# Patient Record
Sex: Female | Born: 1955 | Race: Asian | Hispanic: No | Marital: Married | Smoking: Never smoker
Health system: Southern US, Community
[De-identification: ages and names within clinical notes are randomized; demographics above are authoritative.]

## PROBLEM LIST (undated history)

## (undated) DIAGNOSIS — E162 Hypoglycemia, unspecified: Secondary | ICD-10-CM

---

## 1998-05-23 ENCOUNTER — Emergency Department (HOSPITAL_COMMUNITY): Admission: EM | Admit: 1998-05-23 | Discharge: 1998-05-23 | Payer: Self-pay

## 1998-05-31 ENCOUNTER — Encounter: Admission: RE | Admit: 1998-05-31 | Discharge: 1998-05-31 | Payer: Self-pay | Admitting: *Deleted

## 1999-12-02 ENCOUNTER — Encounter: Admission: RE | Admit: 1999-12-02 | Discharge: 1999-12-02 | Payer: Self-pay | Admitting: Family Medicine

## 1999-12-02 ENCOUNTER — Encounter: Payer: Self-pay | Admitting: Family Medicine

## 1999-12-11 ENCOUNTER — Encounter: Admission: RE | Admit: 1999-12-11 | Discharge: 2000-01-14 | Payer: Self-pay | Admitting: Family Medicine

## 2001-03-28 ENCOUNTER — Encounter: Admission: RE | Admit: 2001-03-28 | Discharge: 2001-03-28 | Payer: Self-pay | Admitting: Family Medicine

## 2001-04-26 ENCOUNTER — Encounter: Admission: RE | Admit: 2001-04-26 | Discharge: 2001-04-26 | Payer: Self-pay | Admitting: Sports Medicine

## 2001-05-02 ENCOUNTER — Ambulatory Visit (HOSPITAL_COMMUNITY): Admission: RE | Admit: 2001-05-02 | Discharge: 2001-05-02 | Payer: Self-pay

## 2001-05-23 ENCOUNTER — Encounter: Admission: RE | Admit: 2001-05-23 | Discharge: 2001-05-23 | Payer: Self-pay | Admitting: Family Medicine

## 2001-05-27 ENCOUNTER — Encounter: Admission: RE | Admit: 2001-05-27 | Discharge: 2001-05-27 | Payer: Self-pay | Admitting: Family Medicine

## 2001-06-03 ENCOUNTER — Encounter (INDEPENDENT_AMBULATORY_CARE_PROVIDER_SITE_OTHER): Payer: Self-pay | Admitting: *Deleted

## 2001-06-03 LAB — CONVERTED CEMR LAB

## 2001-07-04 ENCOUNTER — Encounter: Admission: RE | Admit: 2001-07-04 | Discharge: 2001-07-04 | Payer: Self-pay | Admitting: Family Medicine

## 2001-07-08 ENCOUNTER — Inpatient Hospital Stay (HOSPITAL_COMMUNITY): Admission: AD | Admit: 2001-07-08 | Discharge: 2001-07-08 | Payer: Self-pay | Admitting: Obstetrics & Gynecology

## 2001-07-22 ENCOUNTER — Encounter: Admission: RE | Admit: 2001-07-22 | Discharge: 2001-07-22 | Payer: Self-pay | Admitting: Family Medicine

## 2001-08-04 ENCOUNTER — Encounter: Admission: RE | Admit: 2001-08-04 | Discharge: 2001-08-04 | Payer: Self-pay | Admitting: Family Medicine

## 2001-08-10 ENCOUNTER — Encounter: Admission: RE | Admit: 2001-08-10 | Discharge: 2001-08-10 | Payer: Self-pay | Admitting: Family Medicine

## 2001-08-15 ENCOUNTER — Encounter: Admission: RE | Admit: 2001-08-15 | Discharge: 2001-08-15 | Payer: Self-pay | Admitting: Family Medicine

## 2001-08-19 ENCOUNTER — Encounter (INDEPENDENT_AMBULATORY_CARE_PROVIDER_SITE_OTHER): Payer: Self-pay

## 2001-08-19 ENCOUNTER — Inpatient Hospital Stay (HOSPITAL_COMMUNITY): Admission: AD | Admit: 2001-08-19 | Discharge: 2001-08-23 | Payer: Self-pay | Admitting: Obstetrics & Gynecology

## 2001-09-24 ENCOUNTER — Inpatient Hospital Stay (HOSPITAL_COMMUNITY): Admission: AD | Admit: 2001-09-24 | Discharge: 2001-09-24 | Payer: Self-pay | Admitting: *Deleted

## 2001-12-08 ENCOUNTER — Encounter: Admission: RE | Admit: 2001-12-08 | Discharge: 2001-12-08 | Payer: Self-pay | Admitting: Family Medicine

## 2003-11-16 ENCOUNTER — Encounter: Admission: RE | Admit: 2003-11-16 | Discharge: 2003-11-16 | Payer: Self-pay | Admitting: Family Medicine

## 2006-10-01 ENCOUNTER — Encounter (INDEPENDENT_AMBULATORY_CARE_PROVIDER_SITE_OTHER): Payer: Self-pay | Admitting: *Deleted

## 2009-04-27 ENCOUNTER — Emergency Department (HOSPITAL_COMMUNITY): Admission: EM | Admit: 2009-04-27 | Discharge: 2009-04-27 | Payer: Self-pay | Admitting: Emergency Medicine

## 2010-11-07 LAB — URINALYSIS, ROUTINE W REFLEX MICROSCOPIC
Bilirubin Urine: NEGATIVE
Glucose, UA: NEGATIVE mg/dL
Ketones, ur: NEGATIVE mg/dL
Nitrite: NEGATIVE
Specific Gravity, Urine: 1.014 (ref 1.005–1.030)
pH: 6 (ref 5.0–8.0)

## 2010-11-07 LAB — POCT I-STAT, CHEM 8
BUN: 5 mg/dL — ABNORMAL LOW (ref 6–23)
Calcium, Ion: 1.17 mmol/L (ref 1.12–1.32)
Chloride: 104 mEq/L (ref 96–112)
Creatinine, Ser: 0.5 mg/dL (ref 0.4–1.2)
Glucose, Bld: 77 mg/dL (ref 70–99)
Potassium: 3.7 mEq/L (ref 3.5–5.1)

## 2010-11-07 LAB — GLUCOSE, CAPILLARY: Glucose-Capillary: 73 mg/dL (ref 70–99)

## 2010-11-07 LAB — URINE MICROSCOPIC-ADD ON

## 2010-12-19 NOTE — Discharge Summary (Signed)
[facility]  Patient:    Michele Stephenson, Michele Stephenson Visit Number: [id number] MRN: [id number]          Service Type: OBS Location: [id number] Attending Physician:  Antionette Char Dictated by:   Ed Blalock. Burnadette Peter, M.D. Admit Date:  08/19/2001 Discharge Date: 08/23/2001                             Discharge Summary  DATE OF BIRTH:                1956-06-25  HISTORY OF PRESENT ILLNESS:   A 55 year old G3, P2-0-0-2 presented at 40-2/7 weeks with spontaneous rupture of membranes and contractions.  She is a patient of Dr. Turner Daniels at the family practice center with pregnancy complicated by advanced maternal age.  She has an obstetrical history of spontaneous vaginal delivery in 1992 and 1995.  No GYN, medical, or surgical history.  FAMILY/SOCIAL HISTORY:        She is [place] with limited [place].  PRENATAL LABORATORY DATA:     Normal.  PHYSICAL EXAMINATION:  PELVIC:                       Admission exam showed speculum with scant, clear fluid.  Positive fern.  Digital cervical exam showed her to be 2, 60, -3, with vertex.  The fetal heart was 130 with positive accelerations and average variability with contractions every 3-5 minutes.  HOSPITAL COURSE:              The patient underwent low transverse C-section secondary to arrest of active phase of labor and failure to descend after laboring for some time and undergoing amnioinfusion and Pitocin augmentation of labor.  She was delivered of a viable female infant, Apgars 7 and 9, cord pH 7.29.  She had a routine postop course complicated only by some hemorrhoidal pain which was relieved with Anusol and on hospital day #3 was requesting discharge.  DISCHARGE ACTIVITY:           Pelvic rest, otherwise ad lib.  DISCHARGE DIET:               Regular.  DISCHARGE MEDICATIONS:        1. Percocet 5/325 mg 1 p.o. q.4h. p.r.n. pain.                               2. Motrin 800 mg 1 p.o. t.i.d. x5 days  then                                  p.r.n. t.i.d.                               3. Iron sulfate 325 mg 1 p.o. b.i.d.  DISCHARGE FOLLOWUP:           She will be seen in [facility] in six weeks for routine postpartum care.  DISCHARGE CONDITION:          Improved.  DISCHARGE DIAGNOSES:          1. Intrauterine pregnancy at term.  2. Advanced maternal age.                               3. Arrest of active phase of labor and arrest of                                  descent.                               4. Status post low transverse cesarean section.                               5. External hemorrhoids.                               6. Anemia. Dictated by:   Ed Blalock. Burnadette Peter, M.D. Attending Physician:  Antionette Char DD:  08/23/01 TD:  08/24/01 Job: [id number]

## 2010-12-19 NOTE — Op Note (Signed)
[facility]  Patient:    [name], Michele Stephenson Visit Number: [id number] MRN: [id number]          Service Type: ANT Location: [facility] Attending Physician:  Antionette Char Dictated by:   Conni Elliot, M.D. Proc. Date: 08/20/01                             Operative Report  PREOPERATIVE DIAGNOSIS:       Failure to descend and repetitive variable fetal heart decelerations.  POSTOPERATIVE DIAGNOSIS:      Failure to descend and repetitive variable fetal heart decelerations, body/arm/leg cord.  OPERATION:                    Low transverse cesarean.  SURGEON:                      Conni Elliot, M.D.  ANESTHESIA:                   Continuous lumbar epidural.  OPERATIVE FINDINGS:           An 8 pound 5 ounce female with Apgars of 7 and 9.  Cord pH 7.29.  Placenta was sent to pathology.  Amniotic fluid was clear, although there was evidence of meconium in the vernix on the baby.  OPERATIVE PROCEDURE:          Patient brought to the operating room with spinal in left lateral tilt position receiving oxygen.  Abdomen was prepped and draped in a sterile fashion.  Low transverse Pfannenstiel incision was made.  Incision made through the skin and subcutaneous fascia.  Rectus muscles separated in the midline.  Peritoneal cavity entered.  Bladder flap created. Low transverse uterine incision was made.  Baby was delivered from vertex presentation.  Cord doubly clamped and cut.  Baby was DeLee suctioned prior to delivery of the chest.  Remainder of body delivered.  Placenta was delivered spontaneously.  Uterus, bladder flap, and superior fascia, subcutaneous skin ______ fashion.  Estimated blood loss less than 800 cc.  Needle and sponge correct. Dictated by:   Conni Elliot, M.D. Attending Physician:  Antionette Char DD:  08/20/01 TD:  08/22/01 Job: [id number]

## 2011-01-16 IMAGING — CT CT HEAD W/O CM
1 of 2 series · 13 of 30 positions shown, 17 images · non-contrast
Comparison: None

CLINICAL DATA: Dizziness.  Headache.

CT HEAD WITHOUT CONTRAST
TECHNIQUE: Contiguous axial images were obtained from the base of
the skull through the vertex without contrast.

[Series 2: brain · axial · 0.47mm/px · z∈[+112,+233]mm · 13 of 28 slices shown, 17 images]
[im 2/28  brain]
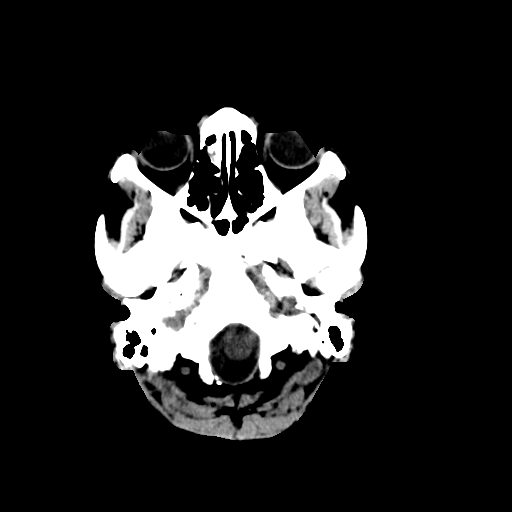
[im 2/28  bone]
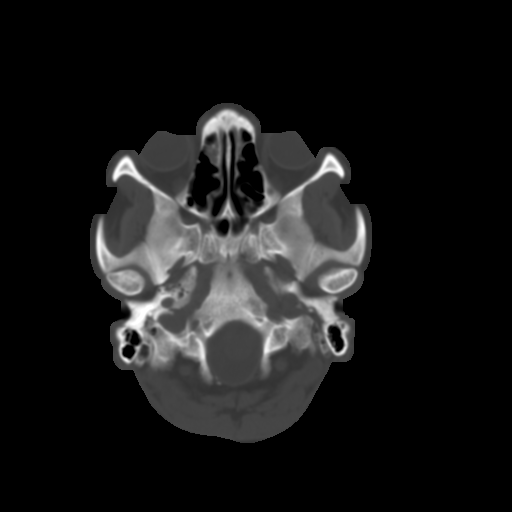
[im 4/28  brain]
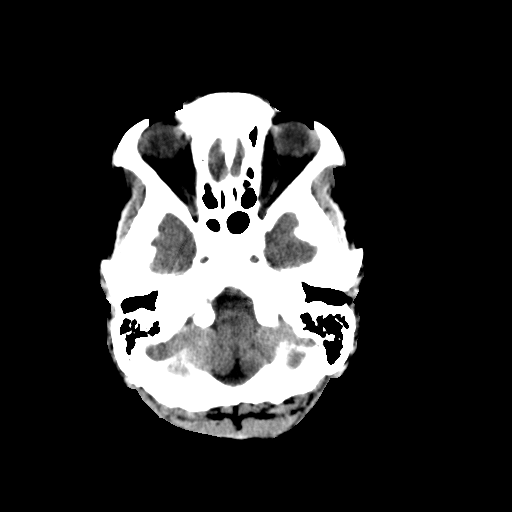
[im 6/28  brain]
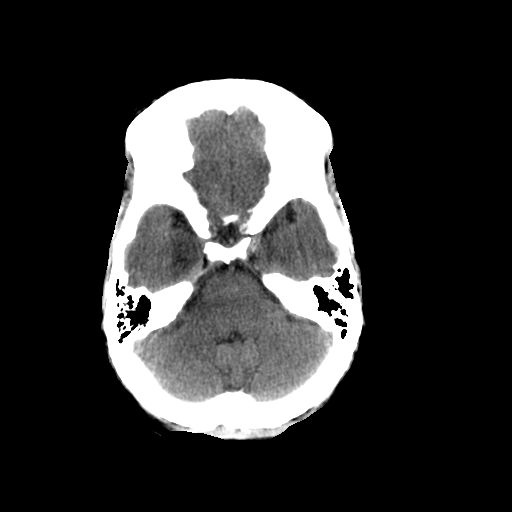
[im 8/28  brain]
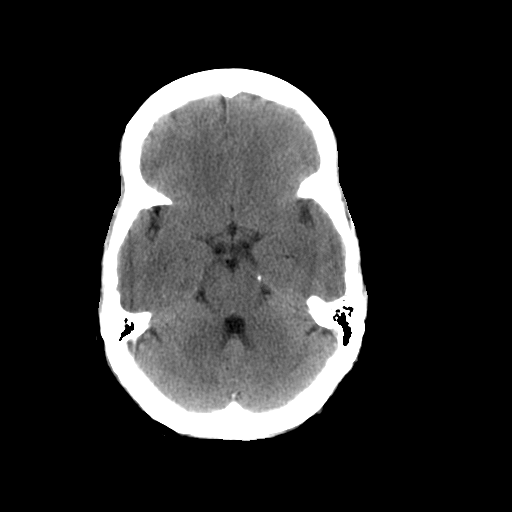
[im 10/28  brain]
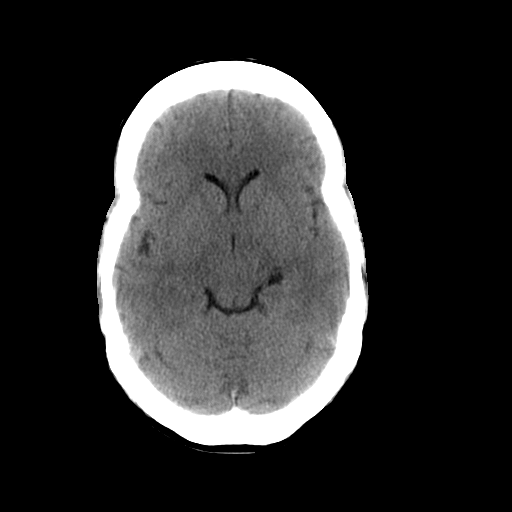
[im 10/28  bone]
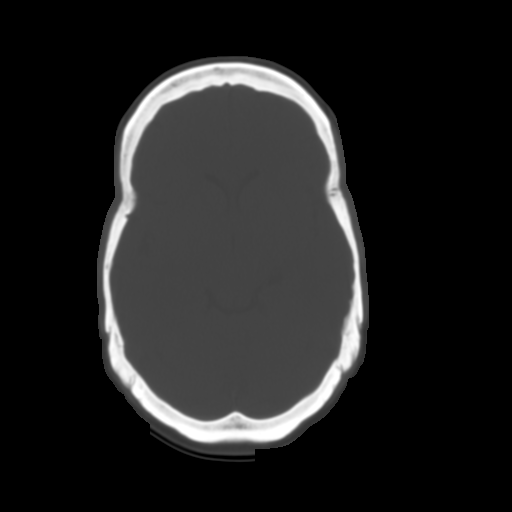
[im 12/28  brain]
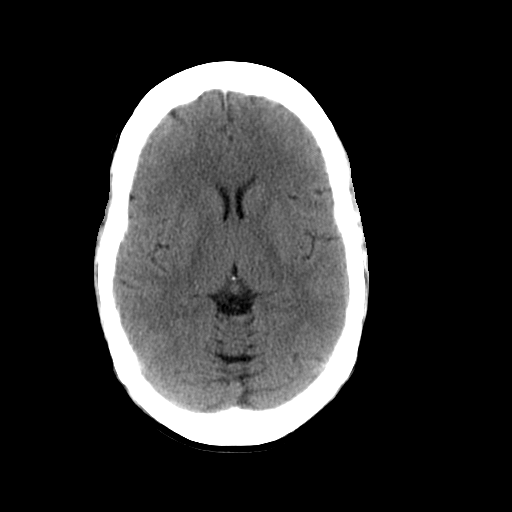
[im 14/28  brain]
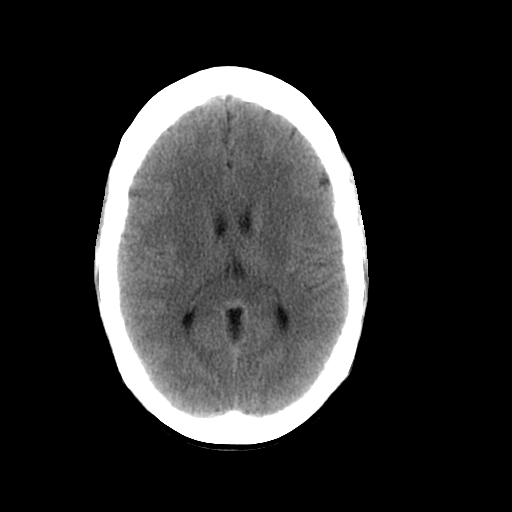
[im 16/28  brain]
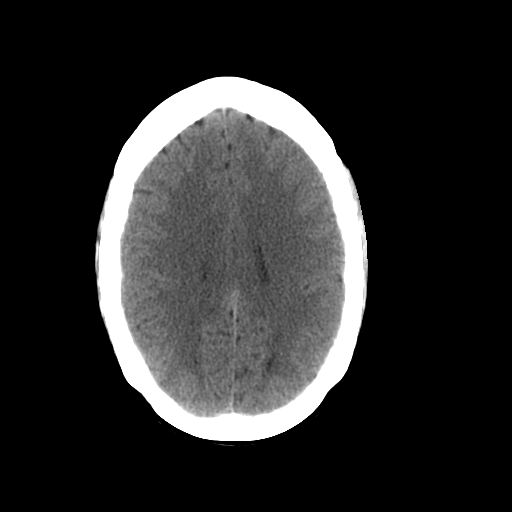
[im 18/28  brain]
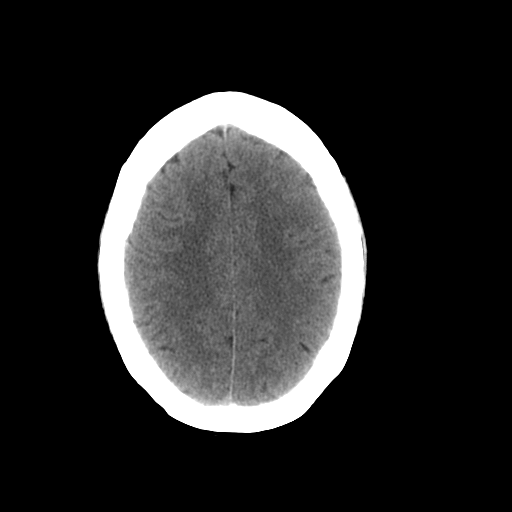
[im 18/28  bone]
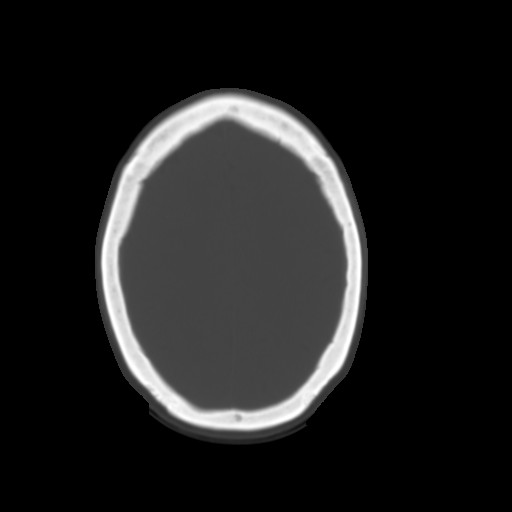
[im 20/28  brain]
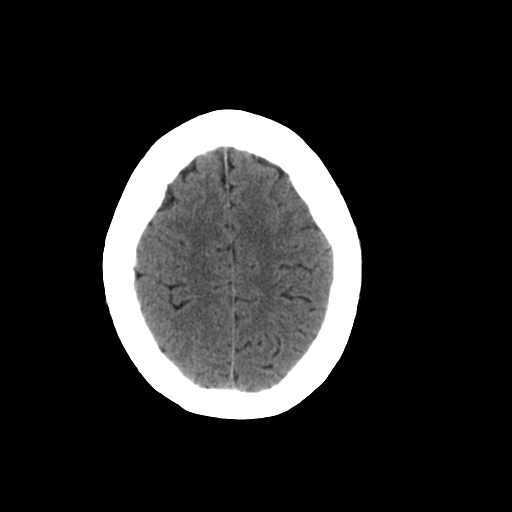
[im 22/28  brain]
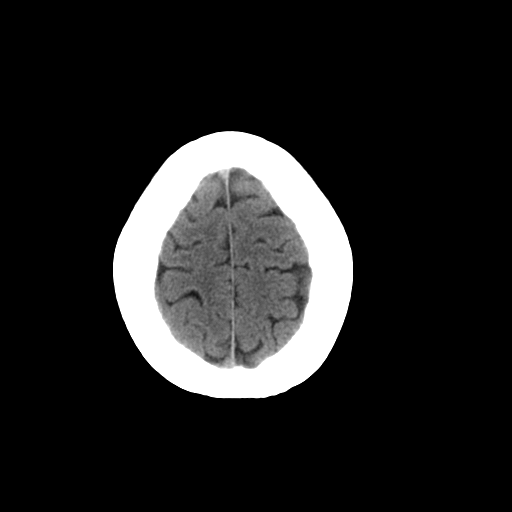
[im 24/28  brain]
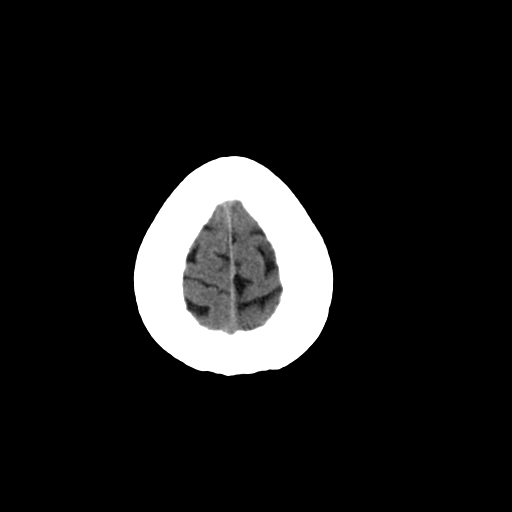
[im 26/28  brain]
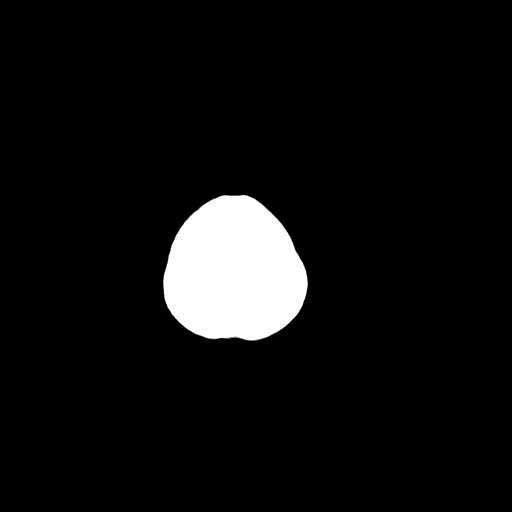
[im 26/28  bone]
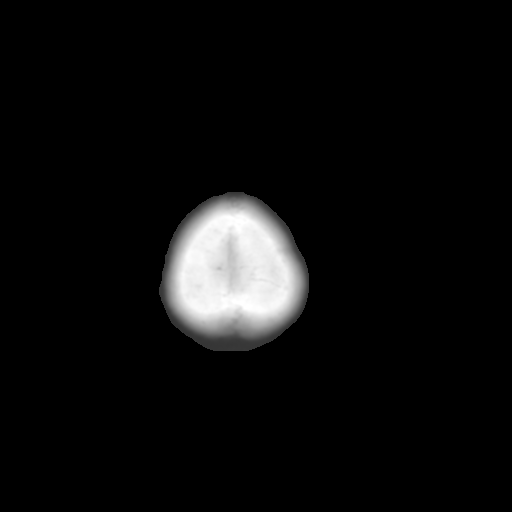

[13 of 30 positions shown; findings below may reference images not displayed]

FINDINGS: No acute intracranial findings.  Negative for hemorrhage,
mass lesion, or acute infarction.  No hydrocephalus.  There is a 7
x 8 mm left choroidal fissure cyst.  This is a benign incidental
finding.
IMPRESSION: No acute intracranial findings.  Incidental left choroidal fissure
cyst.

## 2012-01-13 ENCOUNTER — Other Ambulatory Visit: Payer: Self-pay | Admitting: Legal Medicine

## 2012-01-13 DIAGNOSIS — Z1231 Encounter for screening mammogram for malignant neoplasm of breast: Secondary | ICD-10-CM

## 2012-01-28 ENCOUNTER — Ambulatory Visit
Admission: RE | Admit: 2012-01-28 | Discharge: 2012-01-28 | Disposition: A | Discharge status: Home / Self Care | Payer: PRIVATE HEALTH INSURANCE | Source: Ambulatory Visit | Attending: Legal Medicine | Admitting: Legal Medicine

## 2012-01-28 ENCOUNTER — Ambulatory Visit: Payer: Self-pay

## 2012-01-28 DIAGNOSIS — Z1231 Encounter for screening mammogram for malignant neoplasm of breast: Secondary | ICD-10-CM

## 2012-03-30 ENCOUNTER — Emergency Department (HOSPITAL_COMMUNITY)
Admission: EM | Admit: 2012-03-30 | Discharge: 2012-03-31 | Disposition: A | Discharge status: Home / Self Care | Payer: PRIVATE HEALTH INSURANCE | Attending: Emergency Medicine | Admitting: Emergency Medicine

## 2012-03-30 DIAGNOSIS — R0602 Shortness of breath: Secondary | ICD-10-CM | POA: Insufficient documentation

## 2012-03-30 DIAGNOSIS — A088 Other specified intestinal infections: Secondary | ICD-10-CM | POA: Insufficient documentation

## 2012-03-30 DIAGNOSIS — R51 Headache: Secondary | ICD-10-CM

## 2012-03-30 DIAGNOSIS — D649 Anemia, unspecified: Secondary | ICD-10-CM | POA: Insufficient documentation

## 2012-03-30 DIAGNOSIS — R5381 Other malaise: Secondary | ICD-10-CM | POA: Insufficient documentation

## 2012-03-30 DIAGNOSIS — R7309 Other abnormal glucose: Secondary | ICD-10-CM | POA: Insufficient documentation

## 2012-03-30 DIAGNOSIS — R739 Hyperglycemia, unspecified: Secondary | ICD-10-CM

## 2012-03-30 DIAGNOSIS — R42 Dizziness and giddiness: Secondary | ICD-10-CM

## 2012-03-30 DIAGNOSIS — A084 Viral intestinal infection, unspecified: Secondary | ICD-10-CM

## 2012-03-30 DIAGNOSIS — R5383 Other fatigue: Secondary | ICD-10-CM | POA: Insufficient documentation

## 2012-03-30 NOTE — ED Notes (Addendum)
Pt is having dizziness, nausua, vomited once on Monday; had lunch today; having stomach cramping; last BM Saturday; also reporting headache.

## 2012-03-31 ENCOUNTER — Emergency Department (HOSPITAL_COMMUNITY): Payer: PRIVATE HEALTH INSURANCE

## 2012-03-31 LAB — CBC WITH DIFFERENTIAL/PLATELET
Basophils Absolute: 0 10*3/uL (ref 0.0–0.1)
Eosinophils Relative: 1 % (ref 0–5)
HCT: 32.2 % — ABNORMAL LOW (ref 36.0–46.0)
Hemoglobin: 10.4 g/dL — ABNORMAL LOW (ref 12.0–15.0)
Lymphocytes Relative: 20 % (ref 12–46)
MCV: 64.9 fL — ABNORMAL LOW (ref 78.0–100.0)
Monocytes Relative: 5 % (ref 3–12)
Neutro Abs: 6.5 10*3/uL (ref 1.7–7.7)
RBC: 4.96 MIL/uL (ref 3.87–5.11)
RDW: 18.4 % — ABNORMAL HIGH (ref 11.5–15.5)
WBC: 8.8 10*3/uL (ref 4.0–10.5)

## 2012-03-31 LAB — COMPREHENSIVE METABOLIC PANEL
AST: 20 U/L (ref 0–37)
Albumin: 3.7 g/dL (ref 3.5–5.2)
Alkaline Phosphatase: 62 U/L (ref 39–117)
BUN: 9 mg/dL (ref 6–23)
CO2: 24 mEq/L (ref 19–32)
Chloride: 101 mEq/L (ref 96–112)
GFR calc non Af Amer: >90 mL/min (ref >90)
Potassium: 3.3 mEq/L — ABNORMAL LOW (ref 3.5–5.1)
Total Bilirubin: 0.4 mg/dL (ref 0.3–1.2)

## 2012-03-31 LAB — URINE MICROSCOPIC-ADD ON

## 2012-03-31 LAB — URINALYSIS, ROUTINE W REFLEX MICROSCOPIC
Glucose, UA: NEGATIVE mg/dL
Hgb urine dipstick: NEGATIVE
Ketones, ur: NEGATIVE mg/dL
Protein, ur: NEGATIVE mg/dL

## 2012-03-31 MED ORDER — ONDANSETRON HCL 4 MG/2ML IJ SOLN
4.0000 mg | Freq: Once | INTRAMUSCULAR | Status: AC
  Administered 2012-03-31: 4 mg via INTRAVENOUS
  Filled 2012-03-31: qty 2

## 2012-03-31 MED ORDER — ACETAMINOPHEN 325 MG PO TABS
650.0000 mg | ORAL_TABLET | Freq: Once | ORAL | Status: AC
  Administered 2012-03-31: 650 mg via ORAL
  Filled 2012-03-31: qty 2

## 2012-03-31 MED ORDER — LORAZEPAM 2 MG/ML IJ SOLN
1.0000 mg | Freq: Once | INTRAMUSCULAR | Status: AC
  Administered 2012-03-31: 1 mg via INTRAVENOUS
  Filled 2012-03-31: qty 1

## 2012-03-31 MED ORDER — ONDANSETRON HCL 4 MG PO TABS: 4.0000 mg | ORAL_TABLET | Freq: Four times a day (QID) | ORAL | Status: AC

## 2012-03-31 MED ORDER — MECLIZINE HCL 50 MG PO TABS: 50.0000 mg | ORAL_TABLET | Freq: Three times a day (TID) | ORAL | Status: AC | PRN

## 2012-03-31 MED ORDER — MECLIZINE HCL 25 MG PO TABS
25.0000 mg | ORAL_TABLET | Freq: Once | ORAL | Status: AC
  Administered 2012-03-31: 25 mg via ORAL
  Filled 2012-03-31: qty 1

## 2012-03-31 MED ORDER — SODIUM CHLORIDE 0.9 % IV BOLUS (SEPSIS)
1000.0000 mL | Freq: Once | INTRAVENOUS | Status: AC
  Administered 2012-03-31: 1000 mL via INTRAVENOUS

## 2012-03-31 NOTE — ED Provider Notes (Signed)
History     CSN: [id number]  Arrival date & time 03/30/12  2313   First MD Initiated Contact with Patient 03/30/12 2351      No chief complaint on file.   (Consider location/radiation/quality/duration/timing/severity/associated sxs/prior treatment) HPI 56 year old [place] female presents to emergency department with multiple complaints. Patient does not speak [place], and daughter is doing the translating. Patient has had 3-4 days of generalized weakness and dizziness described as vertigo, diffuse abdominal pain, headache and neck pain, and constipation. Patient denies fevers, she's been eating and drinking well. Prior surgeries include C-section. She has a history of vertigo and has had taken meclizine in the past with good results. Patient had one episode of vomiting on Monday, none since then. Patient has a history of headaches which she normally takes over-the-counter medications, but has not done so this this time, has been taking herbal remedies and iron pills given to her by her daughter. Headache is described as posterior rating down into her neck. No stiffness or limitation in movement. Patient denies previous history of constipation. She has not taken anything to have a bowel movement.  Pt does not have a PMD.  Other than h/o vertigo when she still has frequently, and GERD, no other medical issues.  No past medical history on file.  No past surgical history on file.  No family history on file.  History  Substance Use Topics  . Smoking status: Not on file  . Smokeless tobacco: Not on file  . Alcohol Use: Not on file    OB History    No data available      Review of Systems  Unable to perform ROS language barrier  Allergies  Review of patient's allergies indicates no known allergies.  Home Medications   Current Outpatient Rx  Name Route Sig Dispense Refill  . ACETAMINOPHEN 325 MG PO TABS Oral Take 650 mg by mouth every 6 (six) hours as needed. Occasional use  for pain    . FERROUS SULFATE 325 (65 FE) MG PO TABS Oral Take 325 mg by mouth 2 (two) times daily.    . ADULT MULTIVITAMIN W/MINERALS CH Oral Take 1 tablet by mouth daily.      BP 133/70  Pulse 61  Temp 97.6 F (36.4 C) (Oral)  Resp 14  SpO2 100%  LMP 02/29/2012  Physical Exam  Nursing note and vitals reviewed. Constitutional: She is oriented to person, place, and time. She appears well-developed and well-nourished.  HENT:  Head: Normocephalic and atraumatic.  Nose: Nose normal.  Mouth/Throat: Oropharynx is clear and moist.  Eyes: Conjunctivae and EOM are normal. Pupils are equal, round, and reactive to light.  Neck: Normal range of motion. Neck supple. No JVD present. No tracheal deviation present. No thyromegaly present.  Cardiovascular: Normal rate, regular rhythm, normal heart sounds and intact distal pulses.  Exam reveals no gallop and no friction rub.   No murmur heard. Pulmonary/Chest: Effort normal and breath sounds normal. No stridor. No respiratory distress. She has no wheezes. She has no rales. She exhibits no tenderness.  Abdominal: Soft. Bowel sounds are normal. She exhibits no distension and no mass. There is tenderness (diffuse mild tenderness). There is no rebound and no guarding.  Musculoskeletal: Normal range of motion. She exhibits no edema and no tenderness.  Lymphadenopathy:    She has no cervical adenopathy.  Neurological: She is alert and oriented to person, place, and time. She has normal reflexes. No cranial nerve deficit. She exhibits normal muscle tone.  Coordination normal.  Skin: Skin is warm and dry. No rash noted. No erythema. No pallor.  Psychiatric: She has a normal mood and affect. Her behavior is normal. Judgment and thought content normal.    ED Course  Procedures (including critical care time)  Labs Reviewed  CBC WITH DIFFERENTIAL - Abnormal; Notable for the following:    Hemoglobin 10.4 (*)     HCT 32.2 (*)     MCV 64.9 (*)     MCH 21.0  (*)     RDW 18.4 (*)     All other components within normal limits  COMPREHENSIVE METABOLIC PANEL - Abnormal; Notable for the following:    Potassium 3.3 (*)     Glucose, Bld 159 (*)     All other components within normal limits  URINALYSIS, ROUTINE W REFLEX MICROSCOPIC - Abnormal; Notable for the following:    APPearance CLOUDY (*)     Leukocytes, UA TRACE (*)     All other components within normal limits  URINE MICROSCOPIC-ADD ON   Dg Abd Acute W/chest  03/31/2012  *RADIOLOGY REPORT*  Clinical Data: Abdominal pain and weakness.  Shortness of breath.  ACUTE ABDOMEN SERIES (ABDOMEN 2 VIEW & CHEST 1 VIEW)  Comparison: None.  Findings: Single view of the chest demonstrates clear lungs and normal heart size.  No pneumothorax or pleural fluid.  Two views of the abdomen show no free intraperitoneal air.  Gas filled, nondilated loops of small and large bowel are present throughout the abdomen.  No unexpected abdominal calcification is noted.  IUD is in place.  IMPRESSION:  1.  Nonobstructive bowel gas pattern suggestive of gastroenteritis. 2.  No acute cardiopulmonary disease.   Original Report Authenticated By: Bernadene Bell. D'ALESSIO, M.D.      1. Viral gastroenteritis   2. Vertigo   3. Headache   4. Anemia   5. Hyperglycemia       MDM  56 yo female with abd pain, headache, vertigo.  Will check labwork, give nausea/vertigo medications and reassess        Olivia Mackie, MD 03/31/12 [id number]

## 2012-03-31 NOTE — ED Notes (Signed)
Pt unable to void at this time pt is aware of needed void 

## 2012-03-31 NOTE — ED Notes (Signed)
Pt states she has continued dizziness, also headache remains. Pt resting in bed with family at bedside.

## 2012-03-31 NOTE — ED Notes (Signed)
MD Norlene Campbell updated on patient complaints after medication, new orders received, MD aware that patient has not voided for urine sample, stated to give fluid bolus and re-evaluate. Pt informed.

## 2014-08-19 ENCOUNTER — Encounter (HOSPITAL_COMMUNITY): Payer: Self-pay | Admitting: *Deleted

## 2014-08-19 ENCOUNTER — Observation Stay (HOSPITAL_COMMUNITY)
Admission: EM | Admit: 2014-08-19 | Discharge: 2014-08-21 | Disposition: A | Discharge status: Home / Self Care | Payer: 59 | Attending: General Surgery | Admitting: General Surgery

## 2014-08-19 ENCOUNTER — Emergency Department (HOSPITAL_COMMUNITY): Payer: 59

## 2014-08-19 DIAGNOSIS — K3580 Unspecified acute appendicitis: Secondary | ICD-10-CM | POA: Diagnosis not present

## 2014-08-19 DIAGNOSIS — D649 Anemia, unspecified: Secondary | ICD-10-CM | POA: Diagnosis not present

## 2014-08-19 DIAGNOSIS — K5900 Constipation, unspecified: Secondary | ICD-10-CM | POA: Diagnosis not present

## 2014-08-19 DIAGNOSIS — R1031 Right lower quadrant pain: Secondary | ICD-10-CM

## 2014-08-19 DIAGNOSIS — R1011 Right upper quadrant pain: Secondary | ICD-10-CM | POA: Diagnosis present

## 2014-08-19 HISTORY — DX: Hypoglycemia, unspecified: E16.2

## 2014-08-19 LAB — URINE MICROSCOPIC-ADD ON

## 2014-08-19 LAB — COMPREHENSIVE METABOLIC PANEL
ALT: 21 U/L (ref 0–35)
ANION GAP: 10 (ref 5–15)
AST: 26 U/L (ref 0–37)
Albumin: 4.1 g/dL (ref 3.5–5.2)
Alkaline Phosphatase: 70 U/L (ref 39–117)
BILIRUBIN TOTAL: 0.5 mg/dL (ref 0.3–1.2)
BUN: 9 mg/dL (ref 6–23)
CO2: 28 mmol/L (ref 19–32)
CREATININE: 0.65 mg/dL (ref 0.50–1.10)
Calcium: 10 mg/dL (ref 8.4–10.5)
Chloride: 101 mEq/L (ref 96–112)
GFR calc Af Amer: >90 mL/min (ref >90)
GFR calc non Af Amer: >90 mL/min (ref >90)
GLUCOSE: 132 mg/dL — AB (ref 70–99)
POTASSIUM: 3.6 mmol/L (ref 3.5–5.1)
SODIUM: 139 mmol/L (ref 135–145)
TOTAL PROTEIN: 7.8 g/dL (ref 6.0–8.3)

## 2014-08-19 LAB — CBC WITH DIFFERENTIAL/PLATELET
BASOS ABS: 0 10*3/uL (ref 0.0–0.1)
Basophils Relative: 0 % (ref 0–1)
Eosinophils Absolute: 0.1 10*3/uL (ref 0.0–0.7)
Eosinophils Relative: 1 % (ref 0–5)
HEMATOCRIT: 41.4 % (ref 36.0–46.0)
Hemoglobin: 13.9 g/dL (ref 12.0–15.0)
LYMPHS ABS: 1.7 10*3/uL (ref 0.7–4.0)
LYMPHS PCT: 16 % (ref 12–46)
MCH: 25.6 pg — AB (ref 26.0–34.0)
MCHC: 33.6 g/dL (ref 30.0–36.0)
MCV: 76.4 fL — AB (ref 78.0–100.0)
MONO ABS: 0.4 10*3/uL (ref 0.1–1.0)
Monocytes Relative: 4 % (ref 3–12)
Neutro Abs: 8.2 10*3/uL — ABNORMAL HIGH (ref 1.7–7.7)
Neutrophils Relative %: 79 % — ABNORMAL HIGH (ref 43–77)
PLATELETS: 274 10*3/uL (ref 150–400)
RBC: 5.42 MIL/uL — AB (ref 3.87–5.11)
RDW: 13.9 % (ref 11.5–15.5)
WBC: 10.4 10*3/uL (ref 4.0–10.5)

## 2014-08-19 LAB — URINALYSIS, ROUTINE W REFLEX MICROSCOPIC
BILIRUBIN URINE: NEGATIVE
GLUCOSE, UA: NEGATIVE mg/dL
KETONES UR: NEGATIVE mg/dL
NITRITE: NEGATIVE
Protein, ur: NEGATIVE mg/dL
SPECIFIC GRAVITY, URINE: 1.009 (ref 1.005–1.030)
UROBILINOGEN UA: 0.2 mg/dL (ref 0.0–1.0)
pH: 8 (ref 5.0–8.0)

## 2014-08-19 LAB — PREGNANCY, URINE: Preg Test, Ur: NEGATIVE

## 2014-08-19 LAB — LIPASE, BLOOD: LIPASE: 31 U/L (ref 11–59)

## 2014-08-19 MED ORDER — SODIUM CHLORIDE 0.9 % IV BOLUS (SEPSIS)
1000.0000 mL | Freq: Once | INTRAVENOUS | Status: AC
  Administered 2014-08-19: 1000 mL via INTRAVENOUS

## 2014-08-19 MED ORDER — ONDANSETRON HCL 4 MG/2ML IJ SOLN
4.0000 mg | Freq: Once | INTRAMUSCULAR | Status: AC
  Administered 2014-08-19: 4 mg via INTRAVENOUS
  Filled 2014-08-19: qty 2

## 2014-08-19 MED ORDER — MORPHINE SULFATE 4 MG/ML IJ SOLN
4.0000 mg | Freq: Once | INTRAMUSCULAR | Status: AC
  Administered 2014-08-19: 4 mg via INTRAVENOUS
  Filled 2014-08-19: qty 1

## 2014-08-19 MED ORDER — IOHEXOL 300 MG/ML  SOLN
80.0000 mL | Freq: Once | INTRAMUSCULAR | Status: AC | PRN
  Administered 2014-08-19: 80 mL via INTRAVENOUS

## 2014-08-19 NOTE — H&P (Signed)
Michele Stephenson is an 59 y.o. female.   Chief Complaint: right sided abdominal pain  HPI: This is a 59 year old female from [place] who does not speak [place]. She has family with her who is interpreting. She presents with a several day history of right-sided abdominal pain as well as constipation. She describes the pain as cramping and intermittent. She points to her right upper quadrant one question and grabs my hand directing it toward her epigastrium right upper quadrant and right flank when directing me to where her discomfort is. She has had minimal bowel movements over the last week. She has had similar abdominal pain in the past. She has a history of anemia per the family but has never had a colonoscopy.  Past Medical History  Diagnosis Date  . Hypoglycemia     History reviewed. No pertinent past surgical history.  No family history on file. Social History:  reports that she has never smoked. She does not have any smokeless tobacco history on file. She reports that she does not drink alcohol. Her drug history is not on file.  Allergies: No Known Allergies   (Not in a hospital admission)  Results for orders placed or performed during the hospital encounter of 08/19/14 (from the past 48 hour(s))  CBC with Differential     Status: Abnormal   Collection Time: 08/19/14  8:35 PM  Result Value Ref Range   WBC 10.4 4.0 - 10.5 K/uL   RBC 5.42 (H) 3.87 - 5.11 MIL/uL   Hemoglobin 13.9 12.0 - 15.0 g/dL   HCT 41.4 36.0 - 46.0 %   MCV 76.4 (L) 78.0 - 100.0 fL   MCH 25.6 (L) 26.0 - 34.0 pg   MCHC 33.6 30.0 - 36.0 g/dL   RDW 13.9 11.5 - 15.5 %   Platelets 274 150 - 400 K/uL   Neutrophils Relative % 79 (H) 43 - 77 %   Neutro Abs 8.2 (H) 1.7 - 7.7 K/uL   Lymphocytes Relative 16 12 - 46 %   Lymphs Abs 1.7 0.7 - 4.0 K/uL   Monocytes Relative 4 3 - 12 %   Monocytes Absolute 0.4 0.1 - 1.0 K/uL   Eosinophils Relative 1 0 - 5 %   Eosinophils Absolute 0.1 0.0 - 0.7 K/uL   Basophils Relative 0 0 -  1 %   Basophils Absolute 0.0 0.0 - 0.1 K/uL  Comprehensive metabolic panel     Status: Abnormal   Collection Time: 08/19/14  8:35 PM  Result Value Ref Range   Sodium 139 135 - 145 mmol/L    Comment: Please note change in reference range.   Potassium 3.6 3.5 - 5.1 mmol/L    Comment: Please note change in reference range.   Chloride 101 96 - 112 mEq/L   CO2 28 19 - 32 mmol/L   Glucose, Bld 132 (H) 70 - 99 mg/dL   BUN 9 6 - 23 mg/dL   Creatinine, Ser 0.65 0.50 - 1.10 mg/dL   Calcium 10.0 8.4 - 10.5 mg/dL   Total Protein 7.8 6.0 - 8.3 g/dL   Albumin 4.1 3.5 - 5.2 g/dL   AST 26 0 - 37 U/L   ALT 21 0 - 35 U/L   Alkaline Phosphatase 70 39 - 117 U/L   Total Bilirubin 0.5 0.3 - 1.2 mg/dL   GFR calc non Af Amer >90 >90 mL/min   GFR calc Af Amer >90 >90 mL/min    Comment: (NOTE) The eGFR has been calculated using  the CKD EPI equation. This calculation has not been validated in all clinical situations. eGFR's persistently <90 mL/min signify possible Chronic Kidney Disease.    Anion gap 10 5 - 15  Lipase, blood     Status: None   Collection Time: 08/19/14  8:35 PM  Result Value Ref Range   Lipase 31 11 - 59 U/L  Pregnancy, urine     Status: None   Collection Time: 08/19/14  8:40 PM  Result Value Ref Range   Preg Test, Ur NEGATIVE NEGATIVE    Comment:        THE SENSITIVITY OF THIS METHODOLOGY IS >20 mIU/mL.   Urinalysis, Routine w reflex microscopic     Status: Abnormal   Collection Time: 08/19/14  8:40 PM  Result Value Ref Range   Color, Urine STRAW (A) YELLOW   APPearance CLOUDY (A) CLEAR   Specific Gravity, Urine 1.009 1.005 - 1.030   pH 8.0 5.0 - 8.0   Glucose, UA NEGATIVE NEGATIVE mg/dL   Hgb urine dipstick TRACE (A) NEGATIVE   Bilirubin Urine NEGATIVE NEGATIVE   Ketones, ur NEGATIVE NEGATIVE mg/dL   Protein, ur NEGATIVE NEGATIVE mg/dL   Urobilinogen, UA 0.2 0.0 - 1.0 mg/dL   Nitrite NEGATIVE NEGATIVE   Leukocytes, UA MODERATE (A) NEGATIVE  Urine microscopic-add on      Status: Abnormal   Collection Time: 08/19/14  8:40 PM  Result Value Ref Range   Squamous Epithelial / LPF FEW (A) RARE   WBC, UA 11-20 <3 WBC/hpf   RBC / HPF 0-2 <3 RBC/hpf   Bacteria, UA FEW (A) RARE   Ct Abdomen Pelvis W Contrast  08/19/2014   CLINICAL DATA:  Right lower quadrant in generalized abdominal pain.  EXAM: CT ABDOMEN AND PELVIS WITH CONTRAST  TECHNIQUE: Multidetector CT imaging of the abdomen and pelvis was performed using the standard protocol following bolus administration of intravenous contrast.  CONTRAST:  14m OMNIPAQUE IOHEXOL 300 MG/ML  SOLN  COMPARISON:  None.  FINDINGS: BODY WALL: Unremarkable.  LOWER CHEST: Unremarkable.  ABDOMEN/PELVIS:  Liver: There is a 12 mm low-density lesion in the subcapsular right liver. Although not visualized on delayed imaging, peripheral nodular enhancement, best visualized coronally, favors hemangioma. There is no evidence of chronic liver disease by morphology or history.  Biliary: No evidence of biliary obstruction or stone.  Pancreas: Prominent but strictly not enlarged common pancreatic duct.  Spleen: Unremarkable.  Adrenals: Unremarkable.  Kidneys and ureters: No hydronephrosis or stone.  Bladder: Unremarkable.  Reproductive: IUD which is in good position.  Bowel: The appendix is identified in the right lower quadrant, curving anterior to the cecum. There is borderline distention (7-8 mm outer wall diameter), and subtle proximal wall wall thickening, but no periappendiceal inflammatory change. No bowel obstruction. Uncomplicated mid duodenal diverticulum.  Retroperitoneum: No mass or adenopathy.  Peritoneum: No ascites or pneumoperitoneum.  Vascular: No acute abnormality.  OSSEOUS: Partially calcified left paracentral disc herniation with lateral recess stenosis.  IMPRESSION: 1. Equivocal appendix due to mild proximal wall thickening. Short follow up CT could be obtained if persistent concern for appendicitis. 2. 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 3. Probable 12 mm hemangioma in the right liver.   Electronically Signed   By: JJorje GuildM.D.   On: 08/19/2014 23:08    Review of Systems  All other systems reviewed and are negative.   Blood pressure 116/62, pulse 52, temperature 97.8 F (36.6 C), temperature source Oral, resp. rate 14,  height _0  (1.575 m), weight 133 lb 8 oz (60.555 kg), SpO2 100 %. Physical Exam  Constitutional: She is oriented to person, place, and time.  Thin female in mild discomfort  HENT:  Head: Normocephalic and atraumatic.  Right Ear: External ear normal.  Left Ear: External ear normal.  Nose: Nose normal.  Mouth/Throat: Oropharynx is clear and moist. No oropharyngeal exudate.  Eyes: Conjunctivae are normal. Pupils are equal, round, and reactive to light. Right eye exhibits no discharge. Left eye exhibits no discharge. No scleral icterus.  Neck: Normal range of motion. No tracheal deviation present.  Cardiovascular: Normal rate, regular rhythm, normal heart sounds and intact distal pulses.   No murmur heard. Respiratory: Effort normal and breath sounds normal.  GI: Soft. She exhibits no distension. There is tenderness. There is guarding. There is no rebound.  There is tenderness with mild guarding in the right upper quadrant. There is no tenderness or guarding in the right lower quadrant  Musculoskeletal: Normal range of motion. She exhibits no edema.  Lymphadenopathy:    She has no cervical adenopathy.  Neurological: She is alert and oriented to person, place, and time.  Skin: Skin is warm and dry. No rash noted. No erythema.  Psychiatric: Her behavior is normal. Judgment normal.     Assessment/Plan Right upper quadrant, right-sided abdominal pain of uncertain etiology.  Based on the location of her pain and tenderness, I doubt appendicitis. Given the amount of time that she has been hurting, I would suspect more impressive findings on the CAT scan if it were  truly appendicitis. This may be biliary in nature. She'll be admitted to the observation and will get a ultrasound of her right upper quadrant. We will also repeat hydrate her. She may need suppository. As she has never had a colonoscopy, she should be referred to a gastroenterologist for this especially in light of her anemia.  Joan Herschberger A 08/19/2014, 11:48 PM

## 2014-08-19 NOTE — ED Notes (Signed)
Patient transported to CT 

## 2014-08-19 NOTE — ED Provider Notes (Signed)
CSN: [id number]     Arrival date & time 08/19/14  2017 History   First MD Initiated Contact with Patient 08/19/14 2047     Chief Complaint  Patient presents with  . Abdominal Pain     (Consider location/radiation/quality/duration/timing/severity/associated sxs/prior Treatment) Patient is a 59 y.o. female presenting with abdominal pain. The history is provided by the patient.  Abdominal Pain Pain location:  RLQ Pain quality: aching, gnawing and shooting   Pain radiates to:  Does not radiate Pain severity:  Moderate Onset quality:  Gradual Duration:  2 days Timing:  Constant Progression:  Worsening Chronicity:  New Context: not sick contacts   Context comment:  Decreased stools for the last 5 days.  small hard stool Relieved by:  Nothing Worsened by:  Eating Ineffective treatments: pepto bisthmal. Associated symptoms: anorexia and constipation   Associated symptoms: no chest pain, no chills, no cough, no diarrhea, no dysuria, no fever, no nausea, no shortness of breath and no vomiting     Past Medical History  Diagnosis Date  . Hypoglycemia    History reviewed. No pertinent past surgical history. No family history on file. History  Substance Use Topics  . Smoking status: Never Smoker   . Smokeless tobacco: Not on file  . Alcohol Use: No   OB History    No data available     Review of Systems  Constitutional: Negative for fever and chills.  Respiratory: Negative for cough and shortness of breath.   Cardiovascular: Negative for chest pain.  Gastrointestinal: Positive for abdominal pain, constipation and anorexia. Negative for nausea, vomiting and diarrhea.  Genitourinary: Negative for dysuria.  All other systems reviewed and are negative.     Allergies  Review of patient's allergies indicates no known allergies.  Home Medications   Prior to Admission medications   Medication Sig Start Date End Date Taking? Authorizing Provider  acetaminophen (TYLENOL) 325  MG tablet Take 650 mg by mouth every 6 (six) hours as needed. Occasional use for pain    Historical Provider, MD  ferrous sulfate 325 (65 FE) MG tablet Take 325 mg by mouth 2 (two) times daily.    Historical Provider, MD  Multiple Vitamin (MULTIVITAMIN WITH MINERALS) TABS Take 1 tablet by mouth daily.    Historical Provider, MD   BP 124/84 mmHg  Pulse 84  Temp(Src) 97.8 F (36.6 C) (Oral)  Resp 16  Ht  (1.575 m)  Wt 133 lb 8 oz (60.555 kg)  BMI 24.41 kg/m2  SpO2 98% Physical Exam  Constitutional: She is oriented to person, place, and time. She appears well-developed and well-nourished. She appears distressed.  HENT:  Head: Normocephalic and atraumatic.  Eyes: EOM are normal. Pupils are equal, round, and reactive to light.  Cardiovascular: Normal rate, regular rhythm, normal heart sounds and intact distal pulses.  Exam reveals no friction rub.   No murmur heard. Pulmonary/Chest: Effort normal and breath sounds normal. She has no wheezes. She has no rales.  Abdominal: Soft. Bowel sounds are normal. She exhibits no distension. There is tenderness in the right lower quadrant. There is rebound and guarding. There is no CVA tenderness.  Musculoskeletal: Normal range of motion. She exhibits no tenderness.  No edema  Neurological: She is alert and oriented to person, place, and time. No cranial nerve deficit.  Skin: Skin is warm and dry. No rash noted.  Psychiatric: She has a normal mood and affect. Her behavior is normal.  Nursing note and vitals reviewed.  ED Course  Procedures (including critical care time) Labs Review Labs Reviewed  CBC WITH DIFFERENTIAL - Abnormal; Notable for the following:    RBC 5.42 (*)    MCV 76.4 (*)    MCH 25.6 (*)    Neutrophils Relative % 79 (*)    Neutro Abs 8.2 (*)    All other components within normal limits  URINALYSIS, ROUTINE W REFLEX MICROSCOPIC - Abnormal; Notable for the following:    Color, Urine STRAW (*)    APPearance CLOUDY (*)     Hgb urine dipstick TRACE (*)    Leukocytes, UA MODERATE (*)    All other components within normal limits  URINE MICROSCOPIC-ADD ON - Abnormal; Notable for the following:    Squamous Epithelial / LPF FEW (*)    Bacteria, UA FEW (*)    All other components within normal limits  PREGNANCY, URINE  COMPREHENSIVE METABOLIC PANEL  LIPASE, BLOOD    Imaging Review Ct Abdomen Pelvis W Contrast  08/19/2014   CLINICAL DATA:  Right lower quadrant in generalized abdominal pain.  EXAM: CT ABDOMEN AND PELVIS WITH CONTRAST  TECHNIQUE: Multidetector CT imaging of the abdomen and pelvis was performed using the standard protocol following bolus administration of intravenous contrast.  CONTRAST:  80mL OMNIPAQUE IOHEXOL 300 MG/ML  SOLN  COMPARISON:  None.  FINDINGS: BODY WALL: Unremarkable.  LOWER CHEST: Unremarkable.  ABDOMEN/PELVIS:  Liver: There is a 12 mm low-density lesion in the subcapsular right liver. Although not visualized on delayed imaging, peripheral nodular enhancement, best visualized coronally, favors hemangioma. There is no evidence of chronic liver disease by morphology or history.  Biliary: No evidence of biliary obstruction or stone.  Pancreas: Prominent but strictly not enlarged common pancreatic duct.  Spleen: Unremarkable.  Adrenals: Unremarkable.  Kidneys and ureters: No hydronephrosis or stone.  Bladder: Unremarkable.  Reproductive: IUD which is in good position.  Bowel: The appendix is identified in the right lower quadrant, curving anterior to the cecum. There is borderline distention (7-8 mm outer wall diameter), and subtle proximal wall wall thickening, but no periappendiceal inflammatory change. No bowel obstruction. Uncomplicated mid duodenal diverticulum.  Retroperitoneum: No mass or adenopathy.  Peritoneum: No ascites or pneumoperitoneum.  Vascular: No acute abnormality.  OSSEOUS: Partially calcified left paracentral disc herniation with lateral recess stenosis.  IMPRESSION: 1. Equivocal  appendix due to mild proximal wall thickening. Short follow up CT could be obtained if persistent concern for appendicitis. 2. Sizable disc herniation at L4-5 with left lateral recess stenosis. 3. Probable 12 mm hemangioma in the right liver.   Electronically Signed   By: Tiburcio Pea M.D.   On: 08/19/2014 23:08     EKG Interpretation None      MDM   Final diagnoses:  RLQ abdominal pain    Patient with abdominal pain for the last 2 days is worsening. Most significant in the right lower quadrant and also states unable to have a bowel movement. States for the last 1 week her stool has been small in quantity and hard. She denies any nausea, vomiting but has a decreased appetite. No prior abdominal surgeries but has a history of having occasional constipation. She does not take any medications regularly at this time. His any urinary symptoms. Biggest concern is for appendicitis and will get CT to further evaluate. Patient given pain control.  11:30 PM Patient with a CT showing an equivocal appendix with mild proximal wall thickening. Recommended repeat CT.  On repeat exam patient still has significant right  lower quadrant pain. Discussed patient with Dr. Raelene BottBlackman Houle, see the patient    Gwyneth SproutWhitney Haidy Kackley, MD 08/19/14 [phone]

## 2014-08-19 NOTE — ED Notes (Signed)
The pt is c/o generalized abd pain for 2days.  No bm for several days n no vomiting.  lmp nojne

## 2014-08-20 ENCOUNTER — Encounter (HOSPITAL_COMMUNITY)
Admission: EM | Disposition: A | Discharge status: Home / Self Care | Payer: Self-pay | Source: Home / Self Care | Attending: Emergency Medicine

## 2014-08-20 ENCOUNTER — Observation Stay (HOSPITAL_COMMUNITY): Payer: 59

## 2014-08-20 ENCOUNTER — Observation Stay (HOSPITAL_COMMUNITY): Payer: 59 | Admitting: Certified Registered"

## 2014-08-20 ENCOUNTER — Encounter (HOSPITAL_COMMUNITY): Payer: Self-pay | Admitting: General Practice

## 2014-08-20 HISTORY — PX: LAPAROSCOPIC APPENDECTOMY: SHX408

## 2014-08-20 HISTORY — PX: APPENDECTOMY: SHX54

## 2014-08-20 LAB — COMPREHENSIVE METABOLIC PANEL
ALBUMIN: 3.3 g/dL — AB (ref 3.5–5.2)
ALT: 16 U/L (ref 0–35)
AST: 20 U/L (ref 0–37)
Alkaline Phosphatase: 57 U/L (ref 39–117)
Anion gap: 9 (ref 5–15)
BILIRUBIN TOTAL: 0.5 mg/dL (ref 0.3–1.2)
BUN: 8 mg/dL (ref 6–23)
CO2: 26 mmol/L (ref 19–32)
CREATININE: 0.62 mg/dL (ref 0.50–1.10)
Calcium: 9.2 mg/dL (ref 8.4–10.5)
Chloride: 105 mEq/L (ref 96–112)
GFR calc Af Amer: >90 mL/min (ref >90)
GFR calc non Af Amer: >90 mL/min (ref >90)
Glucose, Bld: 126 mg/dL — ABNORMAL HIGH (ref 70–99)
POTASSIUM: 4.1 mmol/L (ref 3.5–5.1)
SODIUM: 140 mmol/L (ref 135–145)
Total Protein: 6.5 g/dL (ref 6.0–8.3)

## 2014-08-20 LAB — CBC
HEMATOCRIT: 36.6 % (ref 36.0–46.0)
Hemoglobin: 11.7 g/dL — ABNORMAL LOW (ref 12.0–15.0)
MCH: 24.8 pg — AB (ref 26.0–34.0)
MCHC: 32 g/dL (ref 30.0–36.0)
MCV: 77.5 fL — ABNORMAL LOW (ref 78.0–100.0)
PLATELETS: 204 10*3/uL (ref 150–400)
RBC: 4.72 MIL/uL (ref 3.87–5.11)
RDW: 14 % (ref 11.5–15.5)
WBC: 6.9 10*3/uL (ref 4.0–10.5)

## 2014-08-20 LAB — SURGICAL PCR SCREEN
MRSA, PCR: NEGATIVE
Staphylococcus aureus: NEGATIVE

## 2014-08-20 SURGERY — APPENDECTOMY, LAPAROSCOPIC
Anesthesia: General | Site: Abdomen

## 2014-08-20 MED ORDER — MORPHINE SULFATE 2 MG/ML IJ SOLN
1.0000 mg | INTRAMUSCULAR | Status: DC | PRN
  Administered 2014-08-20 (×2): 2 mg via INTRAVENOUS
  Administered 2014-08-20: 4 mg via INTRAVENOUS
  Administered 2014-08-21: 2 mg via INTRAVENOUS
  Filled 2014-08-20 (×2): qty 1
  Filled 2014-08-20: qty 2
  Filled 2014-08-20: qty 1

## 2014-08-20 MED ORDER — ARTIFICIAL TEARS OP OINT
TOPICAL_OINTMENT | OPHTHALMIC | Status: AC
  Filled 2014-08-20: qty 3.5

## 2014-08-20 MED ORDER — ONDANSETRON HCL 4 MG/2ML IJ SOLN
INTRAMUSCULAR | Status: AC
  Filled 2014-08-20: qty 2

## 2014-08-20 MED ORDER — ARTIFICIAL TEARS OP OINT
TOPICAL_OINTMENT | OPHTHALMIC | Status: DC | PRN
  Administered 2014-08-20: 1 via OPHTHALMIC

## 2014-08-20 MED ORDER — POTASSIUM CHLORIDE IN NACL 20-0.9 MEQ/L-% IV SOLN
INTRAVENOUS | Status: DC
  Administered 2014-08-20 – 2014-08-21 (×3): via INTRAVENOUS
  Filled 2014-08-20 (×8): qty 1000

## 2014-08-20 MED ORDER — SUCCINYLCHOLINE CHLORIDE 20 MG/ML IJ SOLN
INTRAMUSCULAR | Status: AC
  Filled 2014-08-20: qty 1

## 2014-08-20 MED ORDER — BUPIVACAINE-EPINEPHRINE (PF) 0.25% -1:200000 IJ SOLN
INTRAMUSCULAR | Status: AC
  Filled 2014-08-20: qty 30

## 2014-08-20 MED ORDER — DEXTROSE 5 % IV SOLN
2.0000 g | INTRAVENOUS | Status: DC
  Filled 2014-08-20: qty 2

## 2014-08-20 MED ORDER — LIDOCAINE HCL (CARDIAC) 20 MG/ML IV SOLN
INTRAVENOUS | Status: DC | PRN
  Administered 2014-08-20: 50 mg via INTRAVENOUS

## 2014-08-20 MED ORDER — LACTATED RINGERS IV SOLN
INTRAVENOUS | Status: DC | PRN
  Administered 2014-08-20: 11:00:00 via INTRAVENOUS

## 2014-08-20 MED ORDER — MIDAZOLAM HCL 5 MG/5ML IJ SOLN
INTRAMUSCULAR | Status: DC | PRN
  Administered 2014-08-20: 1 mg via INTRAVENOUS

## 2014-08-20 MED ORDER — ENOXAPARIN SODIUM 40 MG/0.4ML ~~LOC~~ SOLN
40.0000 mg | SUBCUTANEOUS | Status: DC
  Administered 2014-08-21: 40 mg via SUBCUTANEOUS
  Filled 2014-08-20: qty 0.4

## 2014-08-20 MED ORDER — DEXTROSE 5 % IV SOLN
2.0000 g | INTRAVENOUS | Status: DC | PRN
  Administered 2014-08-20: 2 g via INTRAVENOUS

## 2014-08-20 MED ORDER — ROCURONIUM BROMIDE 50 MG/5ML IV SOLN
INTRAVENOUS | Status: AC
  Filled 2014-08-20: qty 2

## 2014-08-20 MED ORDER — BUPIVACAINE-EPINEPHRINE 0.25% -1:200000 IJ SOLN
INTRAMUSCULAR | Status: DC | PRN
  Administered 2014-08-20: 30 mL

## 2014-08-20 MED ORDER — GLYCOPYRROLATE 0.2 MG/ML IJ SOLN
INTRAMUSCULAR | Status: AC
  Filled 2014-08-20: qty 3

## 2014-08-20 MED ORDER — MIDAZOLAM HCL 2 MG/2ML IJ SOLN
INTRAMUSCULAR | Status: AC
  Filled 2014-08-20: qty 2

## 2014-08-20 MED ORDER — LIDOCAINE HCL (CARDIAC) 20 MG/ML IV SOLN
INTRAVENOUS | Status: AC
  Filled 2014-08-20: qty 10

## 2014-08-20 MED ORDER — DEXTROSE 5 % IV SOLN: 2.0000 g | INTRAVENOUS | Status: DC

## 2014-08-20 MED ORDER — OXYCODONE-ACETAMINOPHEN 5-325 MG PO TABS
1.0000 | ORAL_TABLET | ORAL | Status: DC | PRN
  Administered 2014-08-21: 2 via ORAL
  Filled 2014-08-20: qty 2

## 2014-08-20 MED ORDER — PHENYLEPHRINE HCL 10 MG/ML IJ SOLN
INTRAMUSCULAR | Status: DC | PRN
  Administered 2014-08-20: 80 ug via INTRAVENOUS

## 2014-08-20 MED ORDER — FENTANYL CITRATE 0.05 MG/ML IJ SOLN
INTRAMUSCULAR | Status: DC | PRN
  Administered 2014-08-20 (×3): 50 ug via INTRAVENOUS

## 2014-08-20 MED ORDER — NEOSTIGMINE METHYLSULFATE 10 MG/10ML IV SOLN
INTRAVENOUS | Status: DC | PRN
  Administered 2014-08-20: 4 mg via INTRAVENOUS

## 2014-08-20 MED ORDER — PROPOFOL 10 MG/ML IV BOLUS
INTRAVENOUS | Status: AC
  Filled 2014-08-20: qty 20

## 2014-08-20 MED ORDER — ONDANSETRON HCL 4 MG/2ML IJ SOLN
INTRAMUSCULAR | Status: DC | PRN
  Administered 2014-08-20: 4 mg via INTRAVENOUS

## 2014-08-20 MED ORDER — 0.9 % SODIUM CHLORIDE (POUR BTL) OPTIME
TOPICAL | Status: DC | PRN
  Administered 2014-08-20: 1000 mL

## 2014-08-20 MED ORDER — EPHEDRINE SULFATE 50 MG/ML IJ SOLN
INTRAMUSCULAR | Status: DC | PRN
  Administered 2014-08-20: 5 mg via INTRAVENOUS

## 2014-08-20 MED ORDER — PROPOFOL 10 MG/ML IV BOLUS
INTRAVENOUS | Status: DC | PRN
  Administered 2014-08-20: 120 mg via INTRAVENOUS

## 2014-08-20 MED ORDER — GLYCOPYRROLATE 0.2 MG/ML IJ SOLN
INTRAMUSCULAR | Status: DC | PRN
  Administered 2014-08-20: 0.6 mg via INTRAVENOUS

## 2014-08-20 MED ORDER — ONDANSETRON HCL 4 MG/2ML IJ SOLN
4.0000 mg | Freq: Four times a day (QID) | INTRAMUSCULAR | Status: AC | PRN
  Administered 2014-08-20: 4 mg via INTRAVENOUS

## 2014-08-20 MED ORDER — LACTATED RINGERS IV SOLN
INTRAVENOUS | Status: DC
  Administered 2014-08-20: 11:00:00 via INTRAVENOUS

## 2014-08-20 MED ORDER — ONDANSETRON HCL 4 MG/2ML IJ SOLN: 4.0000 mg | Freq: Four times a day (QID) | INTRAMUSCULAR | Status: DC | PRN

## 2014-08-20 MED ORDER — ENOXAPARIN SODIUM 40 MG/0.4ML ~~LOC~~ SOLN
40.0000 mg | SUBCUTANEOUS | Status: DC
  Filled 2014-08-20: qty 0.4

## 2014-08-20 MED ORDER — FENTANYL CITRATE 0.05 MG/ML IJ SOLN
INTRAMUSCULAR | Status: AC
  Filled 2014-08-20: qty 5

## 2014-08-20 MED ORDER — ENOXAPARIN SODIUM 40 MG/0.4ML ~~LOC~~ SOLN: 40.0000 mg | SUBCUTANEOUS | Status: DC

## 2014-08-20 MED ORDER — ROCURONIUM BROMIDE 100 MG/10ML IV SOLN
INTRAVENOUS | Status: DC | PRN
  Administered 2014-08-20: 5 mg via INTRAVENOUS
  Administered 2014-08-20: 30 mg via INTRAVENOUS
  Administered 2014-08-20: 5 mg via INTRAVENOUS

## 2014-08-20 MED ORDER — HYDROMORPHONE HCL 1 MG/ML IJ SOLN: 0.2500 mg | INTRAMUSCULAR | Status: DC | PRN

## 2014-08-20 SURGICAL SUPPLY — 48 items
BAG SPEC RTRVL LRG 6X4 10 (ENDOMECHANICALS) ×1
CANISTER SUCTION 2500CC (MISCELLANEOUS) ×3 IMPLANT
CHLORAPREP W/TINT 26ML (MISCELLANEOUS) ×3 IMPLANT
COVER SURGICAL LIGHT HANDLE (MISCELLANEOUS) ×3 IMPLANT
CUTTER FLEX LINEAR 45M (STAPLE) ×3 IMPLANT
DRAPE LAPAROSCOPIC ABDOMINAL (DRAPES) ×3 IMPLANT
DRAPE UTILITY XL STRL (DRAPES) ×6 IMPLANT
ELECT REM PT RETURN 9FT ADLT (ELECTROSURGICAL) ×3
ELECTRODE REM PT RTRN 9FT ADLT (ELECTROSURGICAL) ×1 IMPLANT
ENDOLOOP SUT PDS II  0 18 (SUTURE)
ENDOLOOP SUT PDS II 0 18 (SUTURE) IMPLANT
GAUZE SPONGE 2X2 8PLY STRL LF (GAUZE/BANDAGES/DRESSINGS) IMPLANT
GLOVE BIOGEL M STRL SZ7.5 (GLOVE) ×3 IMPLANT
GLOVE BIOGEL PI IND STRL 7.0 (GLOVE) IMPLANT
GLOVE BIOGEL PI IND STRL 7.5 (GLOVE) IMPLANT
GLOVE BIOGEL PI IND STRL 8 (GLOVE) ×1 IMPLANT
GLOVE BIOGEL PI INDICATOR 7.0 (GLOVE) ×2
GLOVE BIOGEL PI INDICATOR 7.5 (GLOVE) ×2
GLOVE BIOGEL PI INDICATOR 8 (GLOVE) ×2
GLOVE SURG SS PI 7.0 STRL IVOR (GLOVE) ×2 IMPLANT
GOWN STRL REUS W/ TWL LRG LVL3 (GOWN DISPOSABLE) ×2 IMPLANT
GOWN STRL REUS W/ TWL XL LVL3 (GOWN DISPOSABLE) ×1 IMPLANT
GOWN STRL REUS W/TWL LRG LVL3 (GOWN DISPOSABLE) ×6
GOWN STRL REUS W/TWL XL LVL3 (GOWN DISPOSABLE) ×3
KIT BASIN OR (CUSTOM PROCEDURE TRAY) ×3 IMPLANT
KIT ROOM TURNOVER OR (KITS) ×3 IMPLANT
LIQUID BAND (GAUZE/BANDAGES/DRESSINGS) ×2 IMPLANT
NS IRRIG 1000ML POUR BTL (IV SOLUTION) ×3 IMPLANT
PAD ARMBOARD 7.5X6 YLW CONV (MISCELLANEOUS) ×6 IMPLANT
POUCH SPECIMEN RETRIEVAL 10MM (ENDOMECHANICALS) ×3 IMPLANT
RELOAD 45 VASCULAR/THIN (ENDOMECHANICALS) ×3 IMPLANT
RELOAD STAPLE 45 2.5 WHT GRN (ENDOMECHANICALS) IMPLANT
RELOAD STAPLE 45 3.5 BLU ETS (ENDOMECHANICALS) IMPLANT
RELOAD STAPLE TA45 3.5 REG BLU (ENDOMECHANICALS) IMPLANT
SCALPEL HARMONIC ACE (MISCELLANEOUS) ×3 IMPLANT
SCISSORS LAP 5X35 DISP (ENDOMECHANICALS) ×2 IMPLANT
SET IRRIG TUBING LAPAROSCOPIC (IRRIGATION / IRRIGATOR) ×3 IMPLANT
SPECIMEN JAR SMALL (MISCELLANEOUS) ×3 IMPLANT
SPONGE GAUZE 2X2 STER 10/PKG (GAUZE/BANDAGES/DRESSINGS)
SUT MNCRL AB 4-0 PS2 18 (SUTURE) ×3 IMPLANT
SUT VICRYL 0 UR6 27IN ABS (SUTURE) ×2 IMPLANT
TOWEL OR 17X24 6PK STRL BLUE (TOWEL DISPOSABLE) ×3 IMPLANT
TOWEL OR 17X26 10 PK STRL BLUE (TOWEL DISPOSABLE) ×3 IMPLANT
TRAY FOLEY CATH 16FR SILVER (SET/KITS/TRAYS/PACK) ×3 IMPLANT
TRAY LAPAROSCOPIC (CUSTOM PROCEDURE TRAY) ×3 IMPLANT
TROCAR XCEL BLADELESS 5X75MML (TROCAR) ×6 IMPLANT
TROCAR XCEL BLUNT TIP 100MML (ENDOMECHANICALS) ×3 IMPLANT
TUBING INSUFFLATION (TUBING) ×3 IMPLANT

## 2014-08-20 NOTE — Progress Notes (Signed)
UR completed 

## 2014-08-20 NOTE — Anesthesia Postprocedure Evaluation (Signed)
Anesthesia Post Note  Patient: Michele Stephenson  Procedure(s) Performed: Procedure(s) (LRB): APPENDECTOMY LAPAROSCOPIC (N/A)  Anesthesia type: General  Patient location: PACU  Post pain: Pain level controlled  Post assessment: Post-op Vital signs reviewed  Last Vitals: BP 131/68 mmHg  Pulse 59  Temp(Src) 36.6 C (Oral)  Resp 12  Ht 5\' 2"  (1.575 m)  Wt 137 lb 3.2 oz (62.234 kg)  BMI 25.09 kg/m2  SpO2 100%  Post vital signs: Reviewed  Level of consciousness: sedated  Complications: No apparent anesthesia complications\

## 2014-08-20 NOTE — Anesthesia Preprocedure Evaluation (Addendum)
Anesthesia Evaluation  Patient identified by MRN, date of birth, ID band Patient awake    Reviewed: Allergy & Precautions, NPO status , Patient's Chart, lab work & pertinent test results  Airway Mallampati: II  TM Distance: >3 FB Neck ROM: Full    Dental no notable dental hx.    Pulmonary neg pulmonary ROS,  breath sounds clear to auscultation  Pulmonary exam normal       Cardiovascular negative cardio ROS  Rhythm:Regular Rate:Normal     Neuro/Psych negative neurological ROS  negative psych ROS   GI/Hepatic negative GI ROS, Neg liver ROS,   Endo/Other  negative endocrine ROS  Renal/GU negative Renal ROS     Musculoskeletal negative musculoskeletal ROS (+)   Abdominal   Peds  Hematology negative hematology ROS (+)   Anesthesia Other Findings   Reproductive/Obstetrics negative OB ROS                             Anesthesia Physical Anesthesia Plan  ASA: I and emergent  Anesthesia Plan: General   Post-op Pain Management:    Induction: Intravenous  Airway Management Planned: Oral ETT  Additional Equipment:   Intra-op Plan:   Post-operative Plan: Extubation in OR  Informed Consent: I have reviewed the patients History and Physical, chart, labs and discussed the procedure including the risks, benefits and alternatives for the proposed anesthesia with the patient or authorized representative who has indicated his/her understanding and acceptance.   Dental advisory given  Plan Discussed with: CRNA  Anesthesia Plan Comments:         Anesthesia Quick Evaluation

## 2014-08-20 NOTE — Op Note (Signed)
Alverda Nazzaro [id number] 09/07/1955 08/20/2014  Appendectomy, Lap, Procedure Note  Indications: The patient presented with a history of right-sided abdominal pain. A CT revealed findings showing a dilated appendix. An RUQ ultrasound showed no evidence of gallstones or infection. Because her appendix was dilated and had ongoing right lower quadrant pain, I offered appendectomy  Pre-operative Diagnosis: Abdominal pain, right lower quadrant  Post-operative Diagnosis: Same  Surgeon: Atilano Ina   Assistants: none  Anesthesia: General endotracheal anesthesia  ASA Class: 1, E  Procedure Details  The patient was seen again in the Holding Room. The risks, benefits, complications, treatment options, and expected outcomes were discussed with the patient and/or family. The possibilities of perforation of viscus, bleeding, recurrent infection, the need for additional procedures, failure to diagnose a condition, and creating a complication requiring transfusion or operation were discussed. There was concurrence with the proposed plan and informed consent was obtained. The site of surgery was properly noted. The patient was taken to Operating Room, identified as Bonnee Quin and the procedure verified as Appendectomy. A Time Out was held and the above information confirmed. She was given IV antibiotic.   The patient was placed in the supine position and general anesthesia was induced, along with placement of orogastric tube, SCDs, and a Foley catheter. The abdomen was prepped and draped in a sterile fashion. A 1.5 centimeter infraumbilical incision was made.  The umbilical stalk was elevated, and the midline fascia was incised with a #11 blade.  A Kelly clamp was used to confirm entrance into the peritoneal cavity.  A pursestring suture was passed around the incision with a 0 Vicryl.  A 12mm Hasson was introduced into the abdomen and the tails of the suture were used to hold the Hasson in place.   The  pneumoperitoneum was then established to steady pressure of 15 mmHg.  Additional 5 mm cannulas then placed in the left lower quadrant of the abdomen and the suprapubic region under direct visualization. A careful evaluation of the entire abdomen was carried out. The patient was placed in Trendelenburg and left lateral decubitus position. The small intestines were retracted in the cephalad and left lateral direction away from the pelvis and right lower quadrant. She had a redundant sigmoid colon. It had some peritoneal attachments to the RIGHT pelvic inlet and base of the cecum. The patient was found to have an dilated thickened appendix that was laying medially to the cecum behind the redundant loop of sigmoid colon. There was no evidence of perforation. I had to take down the lateral peritoneal attachments of the sigmoid colon to the cecum in order to visualize the base of the appendix.   The appendix was carefully dissected. The appendix was was skeletonized with the harmonic scalpel.   The appendix was divided at its base using an endo-GIA stapler with a white load. No appendiceal stump was left in place. The appendix was removed from the abdomen with an Endocatch bag through the umbilical port.  There was no evidence of bleeding, leakage, or complication after division of the appendix. Irrigation was also performed and irrigate suctioned from the abdomen as well. The sigmoid colon appeared normal. Her uterus and tubes/ovaries; gallbladder appeared normal. Terminal ileum was normal.   The umbilical port site was closed with the purse string suture. The closure was viewed laparoscopically. There was no residual palpable fascial defect. I did place an additional interrupted 0 vicryl suture at the umbilical fascia.  The trocar site skin wounds were closed with  4-0 Monocryl. Liquid band exceed was applied to the skin incisions.  Instrument, sponge, and needle counts were correct at the conclusion of the case.    Findings: The appendix was found to be inflamed. There were not signs of necrosis.  There was not perforation. There was not abscess formation. She had a very redundant sigmoid colon. Uterus, tubes/ovaries, gallbladder grossly appeared normal  Estimated Blood Loss:  Minimal         Drains: none         Specimens: appendix         Complications:  None; patient tolerated the procedure well.         Disposition: PACU - hemodynamically stable.         Condition: stable  Leonda Cristo M. Andrey CampanileWilson, MD, FACS General, BMary Sellaariatric, & Minimally Invasive Surgery [facility] [place]

## 2014-08-20 NOTE — Progress Notes (Signed)
Patient ID: Michele Stephenson, female   DOB: 03-Jun-1956, 59 y.o.   MRN: [id number]    Subjective: Patient now c/o more in RLQ.  Dr. Andrey Campanile spoke to her through her daughter and I spoke to her through an interpreter.    Objective: Vital signs in last 24 hours: Temp:  [97.7 F (36.5 C)-97.8 F (36.6 C)] 97.7 F (36.5 C) (01/18 0428) Pulse Rate:  [52-84] 60 (01/18 0428) Resp:  [14-17] 16 (01/18 0428) BP: (101-124)/(59-84) 103/63 mmHg (01/18 0428) SpO2:  [97 %-100 %] 99 % (01/18 0428) Weight:  [133 lb 8 oz (60.555 kg)-137 lb 3.2 oz (62.234 kg)] 137 lb 3.2 oz (62.234 kg) (01/18 0224) Last BM Date: 08/17/14  Intake/Output from previous day:   Intake/Output this shift:    PE: Abd: soft, tender in RLQ, +BS, ND  Lab Results:   Recent Labs  08/19/14 2035 08/20/14 0427  WBC 10.4 6.9  HGB 13.9 11.7*  HCT 41.4 36.6  PLT 274 204   BMET  Recent Labs  08/19/14 2035 08/20/14 0427  NA 139 140  K 3.6 4.1  CL 101 105  CO2 28 26  GLUCOSE 132* 126*  BUN 9 8  CREATININE 0.65 0.62  CALCIUM 10.0 9.2   PT/INR No results for input(s): LABPROT, INR in the last 72 hours. CMP     Component Value Date/Time   NA 140 08/20/2014 0427   K 4.1 08/20/2014 0427   CL 105 08/20/2014 0427   CO2 26 08/20/2014 0427   GLUCOSE 126* 08/20/2014 0427   BUN 8 08/20/2014 0427   CREATININE 0.62 08/20/2014 0427   CALCIUM 9.2 08/20/2014 0427   PROT 6.5 08/20/2014 0427   ALBUMIN 3.3* 08/20/2014 0427   AST 20 08/20/2014 0427   ALT 16 08/20/2014 0427   ALKPHOS 57 08/20/2014 0427   BILITOT 0.5 08/20/2014 0427   GFRNONAA >90 08/20/2014 0427   GFRAA >90 08/20/2014 0427   Lipase     Component Value Date/Time   LIPASE 31 08/19/2014 2035       Studies/Results: Ct Abdomen Pelvis W Contrast  08/19/2014   CLINICAL DATA:  Right lower quadrant in generalized abdominal pain.  EXAM: CT ABDOMEN AND PELVIS WITH CONTRAST  TECHNIQUE: Multidetector CT imaging of the abdomen and pelvis was performed using the  standard protocol following bolus administration of intravenous contrast.  CONTRAST:  80mL OMNIPAQUE IOHEXOL 300 MG/ML  SOLN  COMPARISON:  None.  FINDINGS: BODY WALL: Unremarkable.  LOWER CHEST: Unremarkable.  ABDOMEN/PELVIS:  Liver: There is a 12 mm low-density lesion in the subcapsular right liver. Although not visualized on delayed imaging, peripheral nodular enhancement, best visualized coronally, favors hemangioma. There is no evidence of chronic liver disease by morphology or history.  Biliary: No evidence of biliary obstruction or stone.  Pancreas: Prominent but strictly not enlarged common pancreatic duct.  Spleen: Unremarkable.  Adrenals: Unremarkable.  Kidneys and ureters: No hydronephrosis or stone.  Bladder: Unremarkable.  Reproductive: IUD which is in good position.  Bowel: The appendix is identified in the right lower quadrant, curving anterior to the cecum. There is borderline distention (7-8 mm outer wall diameter), and subtle proximal wall wall thickening, but no periappendiceal inflammatory change. No bowel obstruction. Uncomplicated mid duodenal diverticulum.  Retroperitoneum: No mass or adenopathy.  Peritoneum: No ascites or pneumoperitoneum.  Vascular: No acute abnormality.  OSSEOUS: Partially calcified left paracentral disc herniation with lateral recess stenosis.  IMPRESSION: 1. Equivocal appendix due to mild proximal wall thickening. Short follow up CT could  be obtained if persistent concern for appendicitis. 2. Sizable disc herniation at L4-5 with left lateral recess stenosis. 3. Probable 12 mm hemangioma in the right liver.   Electronically Signed   By: Tiburcio PeaJonathan  Watts M.D.   On: 08/19/2014 23:08   [place] Abdomen Limited Ruq  08/20/2014   CLINICAL DATA:  Right upper quadrant pain  EXAM: US ABDOMEN LIMITED - RIGHT UPPER QUADRANT  COMPARISON:  CT scan 08/19/2014  FINDINGS: Gallbladder:  No shadowing gallstones are noted within gallbladder. At least 2 gallbladder wall polyps the largest  measures 2.9 mm. No thickening of gallbladder wall. No sonographic Murphy's sign.  Common bile duct:  Diameter: 4.9 mm in diameter within normal limits.  Liver:  Liver shows normal echogenicity. Subcapsular lesion noted on CT scan is not identified by ultrasound. No intrahepatic biliary ductal dilatation.  IMPRESSION: 1. No shadowing gallstones are noted within gallbladder. Gallbladder wall polyp measures 2.9 mm. No sonographic Murphy's sign. 2. Normal CBD. 3. No intrahepatic biliary ductal dilatation. Subcapsular lesion noted on CT scan is not identified ultrasound.   Electronically Signed   By: Natasha MeadLiviu  Pop M.D.   On: 08/20/2014 08:55    Anti-infectives: Anti-infectives    Start     Dose/Rate Route Frequency Ordered Stop   08/20/14 1045  cefOXitin (MEFOXIN) 2 g in dextrose 5 % 50 mL IVPB     2 g100 mL/hr over 30 Minutes Intravenous On call to O.R. 08/20/14 1033 08/21/14 0559       Assessment/Plan  1. RLQ abdominal pain, ? Appendicitis  Plan: 1. Initially the patient c/o RUQ pain to Dr. Magnus IvanBlackman.  An US was ordered which was negative.  This morning her pain is more RLQ.  Given her CT scan findings, we can not definitively rule out appendicitis.  She does have a history of chronic constipation, but her colon does not have that much stool presently.  After thorough discussion with an interpreter and via her daughter, the patient is agreeable to proceed with a diagnostic laparoscopy, appendectomy. She understands that this may improve her pain, but it may not.  We will proceed to OR today.   LOS: 1 day    Brennah Quraishi E 08/20/2014, 10:34 AM Pager: [phone]

## 2014-08-20 NOTE — Transfer of Care (Signed)
Immediate Anesthesia Transfer of Care Note  Patient: Michele Stephenson  Procedure(s) Performed: Procedure(s): APPENDECTOMY LAPAROSCOPIC (N/A)  Patient Location: PACU  Anesthesia Type:General  Level of Consciousness: awake, alert  and oriented  Airway & Oxygen Therapy: Patient Spontanous Breathing and Patient connected to nasal cannula oxygen  Post-op Assessment: Report given to PACU RN, Post -op Vital signs reviewed and stable and Patient moving all extremities X 4  Post vital signs: Reviewed and stable  Complications: No apparent anesthesia complications

## 2014-08-21 MED ORDER — OXYCODONE-ACETAMINOPHEN 5-325 MG PO TABS: 1.0000 | ORAL_TABLET | ORAL | Status: DC | PRN

## 2014-08-21 NOTE — Progress Notes (Signed)
Discharge home. Discharge instruction given to daughter, patient speaks very little english, daughter translating discharge instruction. No questions verbalized. Prescription given as well.

## 2014-08-21 NOTE — Discharge Summary (Signed)
Patient ID: Michele Stephenson MRN: [id number] DOB/AGE: 57/09/1955 57 y.o.  Admit date: 08/19/2014 Discharge date: 08/21/2014  Procedures: lap appy  Consults: None  Reason for Admission: This is a 59 year old female from [place] who does not speak [place]. She has family with her who is interpreting. She presents with a several day history of right-sided abdominal pain as well as constipation. She describes the pain as cramping and intermittent. She points to her right upper quadrant one question and grabs my hand directing it toward her epigastrium right upper quadrant and right flank when directing me to where her discomfort is. She has had minimal bowel movements over the last week. She has had similar abdominal pain in the past. She has a history of anemia per the family but has never had a colonoscopy.  Admission Diagnoses:  1. Abdominal pain, right sided  Hospital Course: The patient was admitted.  Her pain initially was more RUQ and an US was obtained to rule out gallbladder disease.  This was negative.  Later in the morning, her pain migrated to her RLQ.  A lap appy was recommended.  We proceeded with this.  She tolerated this well.  On POD 1, her presenting pain was resolved.  She was tolerating oral pain meds and an oral diet.  She was stable for dc home.  PE: Abd: soft, appropriately tender, +BS, ND, incisions c/d/i with dermabond  Discharge Diagnoses:  Active Problems:   RUQ abdominal pain appendicitis, s/p lap appy  Discharge Medications:   Medication List    TAKE these medications        acetaminophen 325 MG tablet  Commonly known as:  TYLENOL  Take 650 mg by mouth every 6 (six) hours as needed. Occasional use for pain     ferrous sulfate 325 (65 FE) MG tablet  Take 325 mg by mouth 2 (two) times daily.     multivitamin with minerals Tabs tablet  Take 1 tablet by mouth daily.     oxyCODONE-acetaminophen 5-325 MG per tablet  Commonly known as:  PERCOCET/ROXICET  Take 1-2  tablets by mouth every 4 (four) hours as needed for moderate pain.        Discharge Instructions: Follow-up Information    [facility].   Why:  Doc of the Week clinic, 3:00pm, arrive no later than 2:30pm for paperwork.  Make sure you bring your daughter to interpret   Contact information:   [address] [place] [address] [phone]       Follow up with gastroenterologist for a colonoscopy.      Signed: Dayquan Buys E 08/21/2014, 10:09 AM

## 2014-08-21 NOTE — Discharge Instructions (Signed)
[facility] ______[facility] °LAPAROSCOPIC SURGERY: POST OP INSTRUCTIONS °Always review your discharge instruction sheet given to you by the facility where your surgery was performed. °IF YOU HAVE DISABILITY OR FAMILY LEAVE FORMS, YOU MUST BRING THEM TO THE OFFICE FOR PROCESSING.   °DO NOT GIVE THEM TO YOUR DOCTOR. ° °1. A prescription for pain medication may be given to you upon discharge.  Take your pain medication as prescribed, if needed.  If narcotic pain medicine is not needed, then you may take acetaminophen (Tylenol) or ibuprofen (Advil) as needed. °2. Take your usually prescribed medications unless otherwise directed. °3. If you need a refill on your pain medication, please contact your pharmacy.  They will contact our office to request authorization. Prescriptions will not be filled after 5pm or on week-ends. °4. You should follow a light diet the first few days after arrival home, such as soup and crackers, etc.  Be sure to include lots of fluids daily. °5. Most patients will experience some swelling and bruising in the area of the incisions.  Ice packs will help.  Swelling and bruising can take several days to resolve.  °6. It is common to experience some constipation if taking pain medication after surgery.  Increasing fluid intake and taking a stool softener (such as Colace) will usually help or prevent this problem from occurring.  A mild laxative (Milk of Magnesia or Miralax) should be taken according to package instructions if there are no bowel movements after 48 hours. °7. Unless discharge instructions indicate otherwise, you may remove your bandages 24-48 hours after surgery, and you may shower at that time.  You may have steri-strips (small skin tapes) in place directly over the incision.  These strips should be left on the skin for 7-10 days.  If your surgeon used skin glue on the incision, you may shower in 24 hours.  The glue will flake off over the next 2-3 weeks.  Any sutures or  staples will be removed at the office during your follow-up visit. °8. ACTIVITIES:  You may resume regular (light) daily activities beginning the next day--such as daily self-care, walking, climbing stairs--gradually increasing activities as tolerated.  You may have sexual intercourse when it is comfortable.  Refrain from any heavy lifting or straining until approved by your doctor. °a. You may drive when you are no longer taking prescription pain medication, you can comfortably wear a seatbelt, and you can safely maneuver your car and apply brakes. °b. RETURN TO WORK:  __________________________________________________________ °9. You should see your doctor in the office for a follow-up appointment approximately 2-3 weeks after your surgery.  Make sure that you call for this appointment within a day or two after you arrive home to insure a convenient appointment time. °10. OTHER INSTRUCTIONS: __________________________________________________________________________________________________________________________ __________________________________________________________________________________________________________________________ °WHEN TO CALL YOUR DOCTOR: °1. Fever over 101.0 °2. Inability to urinate °3. Continued bleeding from incision. °4. Increased pain, redness, or drainage from the incision. °5. Increasing abdominal pain ° °The clinic staff is available to answer your questions during regular business hours.  Please don’t hesitate to call and ask to speak to one of the nurses for clinical concerns.  If you have a medical emergency, go to the nearest emergency room or call 911.  A surgeon from [facility] is always on call at the hospital. °[address], [address], [place], [place]  [address] ? [address] [address], [place], [place]   [address] °[phone] ? [phone] ? FAX [phone] °Web site:   [email or web address] °

## 2014-08-22 ENCOUNTER — Encounter (HOSPITAL_COMMUNITY): Payer: Self-pay | Admitting: General Surgery

## 2017-02-09 ENCOUNTER — Encounter (HOSPITAL_COMMUNITY): Payer: Self-pay | Admitting: Emergency Medicine

## 2017-02-09 ENCOUNTER — Ambulatory Visit (HOSPITAL_COMMUNITY)
Admission: EM | Admit: 2017-02-09 | Discharge: 2017-02-09 | Disposition: A | Discharge status: Home / Self Care | Payer: 59 | Attending: Family Medicine | Admitting: Family Medicine

## 2017-02-09 DIAGNOSIS — R109 Unspecified abdominal pain: Secondary | ICD-10-CM | POA: Diagnosis not present

## 2017-02-09 DIAGNOSIS — R300 Dysuria: Secondary | ICD-10-CM

## 2017-02-09 DIAGNOSIS — N3001 Acute cystitis with hematuria: Secondary | ICD-10-CM

## 2017-02-09 DIAGNOSIS — Z3202 Encounter for pregnancy test, result negative: Secondary | ICD-10-CM | POA: Diagnosis not present

## 2017-02-09 LAB — POCT URINALYSIS DIP (DEVICE)
Glucose, UA: 100 mg/dL — AB
Ketones, ur: >=160 mg/dL — AB
NITRITE: POSITIVE — AB
PH: 5.5 (ref 5.0–8.0)
Specific Gravity, Urine: 1.02 (ref 1.005–1.030)
UROBILINOGEN UA: 2 mg/dL — AB (ref 0.0–1.0)

## 2017-02-09 LAB — POCT I-STAT, CHEM 8
BUN: 12 mg/dL (ref 6–20)
Calcium, Ion: 1.14 mmol/L — ABNORMAL LOW (ref 1.15–1.40)
Chloride: 97 mmol/L — ABNORMAL LOW (ref 101–111)
Creatinine, Ser: 0.6 mg/dL (ref 0.44–1.00)
Glucose, Bld: 231 mg/dL — ABNORMAL HIGH (ref 65–99)
HCT: 42 % (ref 36.0–46.0)
Hemoglobin: 14.3 g/dL (ref 12.0–15.0)
Potassium: 3.8 mmol/L (ref 3.5–5.1)
Sodium: 136 mmol/L (ref 135–145)
TCO2: 25 mmol/L (ref 0–100)

## 2017-02-09 LAB — POCT PREGNANCY, URINE: PREG TEST UR: NEGATIVE

## 2017-02-09 MED ORDER — ONDANSETRON 4 MG PO TBDP
ORAL_TABLET | ORAL | Status: AC
  Filled 2017-02-09: qty 1

## 2017-02-09 MED ORDER — ONDANSETRON HCL 4 MG PO TABS: 4.0000 mg | ORAL_TABLET | Freq: Three times a day (TID) | ORAL | 0 refills | Status: DC | PRN

## 2017-02-09 MED ORDER — LIDOCAINE HCL (PF) 1 % IJ SOLN
INTRAMUSCULAR | Status: AC
  Filled 2017-02-09: qty 2

## 2017-02-09 MED ORDER — CEFTRIAXONE SODIUM 1 G IJ SOLR
INTRAMUSCULAR | Status: AC
  Filled 2017-02-09: qty 10

## 2017-02-09 MED ORDER — ONDANSETRON 4 MG PO TBDP
4.0000 mg | ORAL_TABLET | Freq: Once | ORAL | Status: AC
  Administered 2017-02-09: 4 mg via ORAL

## 2017-02-09 MED ORDER — CEFTRIAXONE SODIUM 1 G IJ SOLR
1.0000 g | Freq: Once | INTRAMUSCULAR | Status: AC
  Administered 2017-02-09: 1 g via INTRAMUSCULAR

## 2017-02-09 MED ORDER — SULFAMETHOXAZOLE-TRIMETHOPRIM 800-160 MG PO TABS: 1.0000 | ORAL_TABLET | Freq: Two times a day (BID) | ORAL | 0 refills | Status: AC

## 2017-02-09 NOTE — ED Provider Notes (Signed)
CSN: [id number]     Arrival date & time 02/09/17  1232 History   First MD Initiated Contact with Patient 02/09/17 1253     Chief Complaint  Patient presents with  . Abdominal Pain  . Dysuria   (Consider location/radiation/quality/duration/timing/severity/associated sxs/prior Treatment) 61 year old female comes in with family member for 1 week history of dysuria and abdominal pain. She has not been eating well, and vomits drinking water. States abdominal pain is suprapubic, and around the left lower and upper quadrant. Denies fever, chills, night sweats. Denies weakness, shortness of breath, trouble breathing. Denies diarrhea, constipation. Had normal BM this morning. She is post menopausal and denies vaginal discharge, itching, spotting.    She speaks [place] and we were not able to get a Nurse, learning disability. Family member was able to translate well, but not fluently.       Past Medical History:  Diagnosis Date  . Hypoglycemia    Past Surgical History:  Procedure Laterality Date  . APPENDECTOMY  08/20/2014  . CESAREAN SECTION  2003  . LAPAROSCOPIC APPENDECTOMY N/A 08/20/2014   Procedure: APPENDECTOMY LAPAROSCOPIC;  Surgeon: Atilano Ina, MD;  Location: [facility] OR;  Service: General;  Laterality: N/A;   History reviewed. No pertinent family history. Social History  Substance Use Topics  . Smoking status: Never Smoker  . Smokeless tobacco: Never Used  . Alcohol use No   OB History    No data available     Review of Systems  Constitutional: Positive for appetite change. Negative for chills, diaphoresis, fatigue and fever.  Respiratory: Negative for cough, shortness of breath and wheezing.   Cardiovascular: Negative for chest pain and palpitations.  Gastrointestinal: Positive for abdominal pain and nausea. Negative for constipation, diarrhea and vomiting.  Genitourinary: Positive for difficulty urinating, dysuria, frequency and pelvic pain. Negative for decreased urine volume, flank pain,  hematuria, menstrual problem, urgency, vaginal bleeding, vaginal discharge and vaginal pain.  Neurological: Negative for dizziness, tremors, weakness, light-headedness, numbness and headaches.    Allergies  Patient has no known allergies.  Home Medications   Prior to Admission medications   Not on File   Meds Ordered and Administered this Visit   Medications  ondansetron (ZOFRAN-ODT) disintegrating tablet 4 mg (4 mg Oral Given 02/09/17 1341)    BP 109/68 (BP Location: Left Arm)   Pulse (!) 132   Temp 99 F (37.2 C) (Oral)   Resp 20   LMP 02/29/2012   SpO2 100%  No data found.   Physical Exam  Constitutional: She is oriented to person, place, and time. She appears well-developed and well-nourished. No distress.  HENT:  Head: Normocephalic and atraumatic.  Eyes: Conjunctivae are normal. Pupils are equal, round, and reactive to light. No scleral icterus.  Neck: Normal range of motion. Neck supple.  Cardiovascular: Regular rhythm and normal heart sounds.  Tachycardia present.  Exam reveals no gallop and no friction rub.   No murmur heard. Pulmonary/Chest: Effort normal and breath sounds normal. She has no wheezes. She has no rales.  Abdominal: Soft. Bowel sounds are normal. There is tenderness (Most tender at suprapubic region. Some tenderness on left upper and lower quadrants. No guarding, rebound, masses. Negative Murphy's sign.).  Lymphadenopathy:    She has no cervical adenopathy.  Neurological: She is alert and oriented to person, place, and time.  Skin: Skin is warm and dry.  Psychiatric: She has a normal mood and affect. Her behavior is normal. Judgment normal.    Urgent Care Course  Procedures (including critical care time)  Labs Review Labs Reviewed  POCT URINALYSIS DIP (DEVICE) - Abnormal; Notable for the following:       Result Value   Glucose, UA 100 (*)    Bilirubin Urine SMALL (*)    Ketones, ur >=160 (*)    Hgb urine dipstick MODERATE (*)     Protein, ur >=300 (*)    Urobilinogen, UA 2.0 (*)    Nitrite POSITIVE (*)    Leukocytes, UA LARGE (*)    All other components within normal limits  POCT I-STAT, CHEM 8 - Abnormal; Notable for the following:    Chloride 97 (*)    Glucose, Bld 231 (*)    Calcium, Ion 1.14 (*)    All other components within normal limits  POCT PREGNANCY, URINE    Imaging Review No results found.  Patient with family history of DM, history of hyperglycemia, N/V, UTI with no fluid intake, will check I-stat for renal function and serum glucose.   Patient with tachycardia with low fluid intake due to N/V, zofran ODT 4mg  given at office. Will push fluids and recheck vitals.     MDM   1. Acute cystitis with hematuria    Discussed lab results with patient and family member, urine dipstick with leukocytes, nitrites and blood. Discussed with patient history and exam most consistent with acute cystitis. Given patient's family history of DM, history of hyperglycemia, N/V, has not been PO tolerant, we checked I-stat this visit. Renal function normal. Serum glucose 231 fasting. Discussed with patient and family member she will need to see PCP for evaluation of diabetes. Patient is PO tolerant after zofran. Given patient's presentation today, will give Rocephin 1g in office. Start Bactrim DS BID x 3 days for cystitis. Zofran 4mg  Q8H PRN for nausea/vomiting. Patient to push fluids. Patient with bilirubinuria on dipstick, given patient without RUQ pain, negative Murphy's sign, low suspicion of biliary causes. Patient to monitor for worsening abdominal pain, vomiting not stopped by zofran, fever, weakness, to go to the ED for further evaluation.    Belinda FisherYu, Jaisha Villacres V, PA-C 02/09/17 1426    Linward HeadlandYu, Montay Vanvoorhis V, PA-C 02/09/17 1427

## 2017-02-09 NOTE — Discharge Instructions (Signed)
You have an urinary tract infection. Start Bactrim twice a day for 3 days.  I have given you some Zofran for nausea/vomiting. Take as needed. Drink lots of water. Follow up with PCP for diabetes workup. Monitor for worsening abdominal pain, vomiting not stopped by zofran, fever, weakness, to go to the ED for further evaluation.

## 2017-02-09 NOTE — ED Notes (Signed)
Patient advised to wait 5 minutes then start slow sips of water.

## 2017-02-09 NOTE — ED Triage Notes (Signed)
[facility] a [facility] abdominal pain and dysuria x 1 week.

## 2018-03-15 ENCOUNTER — Encounter (HOSPITAL_COMMUNITY): Payer: Self-pay | Admitting: Emergency Medicine

## 2018-03-15 ENCOUNTER — Ambulatory Visit (HOSPITAL_COMMUNITY)
Admission: EM | Admit: 2018-03-15 | Discharge: 2018-03-15 | Disposition: A | Discharge status: Home / Self Care | Payer: 59 | Attending: Family Medicine | Admitting: Family Medicine

## 2018-03-15 ENCOUNTER — Ambulatory Visit (INDEPENDENT_AMBULATORY_CARE_PROVIDER_SITE_OTHER): Payer: 59

## 2018-03-15 DIAGNOSIS — N390 Urinary tract infection, site not specified: Secondary | ICD-10-CM | POA: Diagnosis not present

## 2018-03-15 DIAGNOSIS — R51 Headache: Secondary | ICD-10-CM | POA: Diagnosis not present

## 2018-03-15 DIAGNOSIS — R110 Nausea: Secondary | ICD-10-CM

## 2018-03-15 DIAGNOSIS — R109 Unspecified abdominal pain: Secondary | ICD-10-CM | POA: Diagnosis not present

## 2018-03-15 LAB — POCT URINALYSIS DIP (DEVICE)
Glucose, UA: NEGATIVE mg/dL
Nitrite: NEGATIVE
PROTEIN: 100 mg/dL — AB
Specific Gravity, Urine: >=1.03 (ref 1.005–1.030)
UROBILINOGEN UA: 0.2 mg/dL (ref 0.0–1.0)
pH: 5.5 (ref 5.0–8.0)

## 2018-03-15 MED ORDER — NITROFURANTOIN MONOHYD MACRO 100 MG PO CAPS: 100.0000 mg | ORAL_CAPSULE | Freq: Two times a day (BID) | ORAL | 0 refills | Status: DC

## 2018-03-15 MED ORDER — KETOROLAC TROMETHAMINE 30 MG/ML IJ SOLN
30.0000 mg | Freq: Once | INTRAMUSCULAR | Status: AC
  Administered 2018-03-15: 30 mg via INTRAMUSCULAR

## 2018-03-15 MED ORDER — KETOROLAC TROMETHAMINE 30 MG/ML IJ SOLN
INTRAMUSCULAR | Status: AC
  Filled 2018-03-15: qty 1

## 2018-03-15 NOTE — Discharge Instructions (Addendum)
It was nice meeting you!!  Your x ray was negative.  We will go ahead and treat you for a urinary infection based on the results.  You can take tylenol and ibuprofen for the pain.  Follow up as needed.

## 2018-03-15 NOTE — ED Provider Notes (Addendum)
[facility]    CSN: [id number] Arrival date & time: 03/15/18  [id number]     History   Chief Complaint Chief Complaint  Patient presents with  . Abdominal Pain  . Headache    HPI Michele Stephenson is a 62 y.o. female.   Pt here for lower ab pain, headache and nausea since yesterday. The problem has been constant and worsening. Nothing makes it worse or better. She denies any urinary symptoms, fever, vaginal bleeding or discharge,  chills bodyaches, night sweats or fatigue. She has some nausea without vomiting. Denies diarrhea. She has been having very small BM's. Denies blood in stool.    The headache started when the abd pain started. The headache is surrounding her entire head. No dizziness, blurred vision. No photophobia or phonophobia. Describes it as tight.    She does not smoke.  Hx of UTI a year ago here.  Hx of appendectomy.   ROS per HPI       Past Medical History:  Diagnosis Date  . Hypoglycemia     Patient Active Problem List   Diagnosis Date Noted  . RUQ abdominal pain 08/19/2014    Past Surgical History:  Procedure Laterality Date  . APPENDECTOMY  08/20/2014  . CESAREAN SECTION  2003  . LAPAROSCOPIC APPENDECTOMY N/A 08/20/2014   Procedure: APPENDECTOMY LAPAROSCOPIC;  Surgeon: Atilano InaEric M Wilson, MD;  [facility];  Service: General;  Laterality: N/A;    OB History   None      Home Medications    Prior to Admission medications   Medication Sig Start Date End Date Taking? Authorizing Provider  nitrofurantoin, macrocrystal-monohydrate, (MACROBID) 100 MG capsule Take 1 capsule (100 mg total) by mouth 2 (two) times daily. 03/15/18   Dahlia ByesBast, Keaunna Skipper A, NP  ondansetron (ZOFRAN) 4 MG tablet Take 1 tablet (4 mg total) by mouth every 8 (eight) hours as needed for nausea or vomiting. 02/09/17   Belinda FisherYu, Amy V, PA-C    Family History No family history on file.  Social History Social History   Tobacco Use  . Smoking status: Never Smoker  . Smokeless  tobacco: Never Used  Substance Use Topics  . Alcohol use: No  . Drug use: No     Allergies   Patient has no known allergies.   Review of Systems Review of Systems   Physical Exam Triage Vital Signs ED Triage Vitals [03/15/18 1037]  Enc Vitals Group     BP 122/85     Pulse Rate (!) 103     Resp      Temp 99.1 F (37.3 C)     Temp Source Oral     SpO2 97 %     Weight      Height      Head Circumference      Peak Flow      Pain Score      Pain Loc      Pain Edu?      Excl. in GC?    No data found.  Updated Vital Signs BP 122/85   Pulse (!) 103   Temp 99.1 F (37.3 C) (Oral)   LMP 02/29/2012   SpO2 97%   Visual Acuity Right Eye Distance:   Left Eye Distance:   Bilateral Distance:    Right Eye Near:   Left Eye Near:    Bilateral Near:     Physical Exam  Constitutional: She appears well-developed and well-nourished.  Non-toxic appearance. She does not appear  ill. No distress.  Pt appears to be in pain on exam table holding abdomen.   HENT:  Head: Normocephalic and atraumatic.  Eyes: Pupils are equal, round, and reactive to light. EOM are normal.  Cardiovascular: Normal rate, regular rhythm and normal heart sounds.  Pulmonary/Chest: Effort normal and breath sounds normal.  Abdominal: Soft. Normal appearance and normal aorta. Bowel sounds are increased. There is tenderness.  No masses. Soft, non distended.  Tenderness to palpation of suprapubic area. No rebound. No CVA tenderness.   Neurological: She is alert.  No focal neuro deficits. No sensory deficits. So slurred speech or facial droop. No unilateral extremity weakness.   Skin: Skin is warm and dry. No rash noted.  Psychiatric: She has a normal mood and affect.  Nursing note and vitals reviewed.    UC Treatments / Results  Labs (all labs ordered are listed, but only abnormal results are displayed) Labs Reviewed  POCT URINALYSIS DIP (DEVICE) - Abnormal; Notable for the following components:       Result Value   Bilirubin Urine SMALL (*)    Ketones, ur TRACE (*)    Hgb urine dipstick MODERATE (*)    Protein, ur 100 (*)    Leukocytes, UA TRACE (*)    All other components within normal limits    EKG None  Radiology Dg Abd 1 View  Result Date: 03/15/2018 CLINICAL DATA:  Abdominal pain, constipation and fever. EXAM: ABDOMEN - 1 VIEW COMPARISON:  None. FINDINGS: Bowel gas pattern is normal. No abnormal abdominal calcifications. IUD in place. Bones are normal. No visible free air or free fluid. IMPRESSION: Benign-appearing abdomen. Specifically, no evidence of excessive stool in the bowel. No fecal impaction. Electronically Signed   By: Francene BoyersJames  Maxwell M.D.   On: 03/15/2018 11:25    Procedures Procedures (including critical care time)  Medications Ordered in UC Medications  ketorolac (TORADOL) 30 MG/ML injection 30 mg (30 mg Intramuscular Given 03/15/18 1112)    Initial Impression / Assessment and Plan / UC Course  I have reviewed the triage vital signs and the nursing notes.  Pertinent labs & imaging results that were available during my care of the patient were reviewed by me and considered in my medical decision making (see chart for details).    UTI-   X ray negative for constipation or other abnormalities Trace leuks with moderate Hgb. Low grade fever with mild tachycardia. No CVA tenderness. Will go ahead and treat  Based on hx and symptoms, pt could not provide enough urine for culture.  Treated headache and abdominal pain in clinic with Toradol injection.  Follow up if not better in the next few days.  Final Clinical Impressions(s) / UC Diagnoses   Final diagnoses:  Lower urinary tract infectious disease     Discharge Instructions     It was nice meeting you!!  Your x ray was negative.  We will go ahead and treat you for a urinary infection based on the results.  You can take tylenol and ibuprofen for the pain.  Follow up as needed.     ED Prescriptions      Medication Sig Dispense Auth. Provider   nitrofurantoin, macrocrystal-monohydrate, (MACROBID) 100 MG capsule Take 1 capsule (100 mg total) by mouth 2 (two) times daily. 10 capsule Dahlia ByesBast, Jamis Kryder A, NP     Controlled Substance Prescriptions [place] Controlled Substance Registry consulted? Not Applicable        Janace ArisBast, Babak Lucus A, NP 03/15/18 1323

## 2018-03-15 NOTE — ED Triage Notes (Signed)
PT reports headache, abdominal pain since yesterday. Right shoulder pain for weeks. Nausea as well.   No vomiting or diarrhea

## 2020-09-12 LAB — HM MAMMOGRAPHY

## 2020-09-16 LAB — HM PAP SMEAR: HM Pap smear: NEGATIVE

## 2020-09-16 LAB — RESULTS CONSOLE HPV: CHL HPV: NEGATIVE

## 2020-11-20 ENCOUNTER — Ambulatory Visit: Payer: Self-pay | Admitting: Family Medicine

## 2020-11-29 ENCOUNTER — Other Ambulatory Visit: Payer: Self-pay

## 2020-11-29 ENCOUNTER — Encounter: Payer: Self-pay | Admitting: Family Medicine

## 2020-11-29 ENCOUNTER — Ambulatory Visit (INDEPENDENT_AMBULATORY_CARE_PROVIDER_SITE_OTHER): Payer: 59 | Admitting: Family Medicine

## 2020-11-29 VITALS — BP 142/80 | HR 63 | Ht 61.75 in | Wt 129.8 lb

## 2020-11-29 DIAGNOSIS — Z7689 Persons encountering health services in other specified circumstances: Secondary | ICD-10-CM | POA: Diagnosis not present

## 2020-11-29 DIAGNOSIS — R030 Elevated blood-pressure reading, without diagnosis of hypertension: Secondary | ICD-10-CM | POA: Diagnosis not present

## 2020-11-29 NOTE — Progress Notes (Signed)
   Subjective:    Patient ID: Michele Stephenson, female    DOB: 29-Jul-1956, 65 y.o.   MRN: [id number]  HPI Chief Complaint  Patient presents with  . new pt    New pt get established. Went to obgyn to get IUD removed and bp was high and concerning   She is new to the practice and here to establish care. Previous medical care has been at her OB/GYN and urgent care.  There is a language barrier and her daughter is with her to translate.  Elevated BP at her OB/GYN office and at home recently.  Blood pressure readings at home have been 135-138/60-80s.   Denies history of hypertension.  Reports having a history of anemia.  Denies fever, chills, dizziness, chest pain, palpitations, shortness of breath, cough, abdominal pain, N/V/D, urinary symptoms, LE edema.    Works in a [employer].  She is active at her job.  No exercise outside of work.  LMP: 02/2012 IUD removed.    Review of Systems Pertinent positives and negatives in the history of present illness.     Objective:   Physical Exam BP (!) 142/80   Pulse 63   Ht 5' 1.75" (1.568 m)   Wt 129 lb 12.8 oz (58.9 kg)   LMP 02/29/2012   SpO2 99%   BMI 23.93 kg/m   Alert and in no distress.  Cardiac exam shows a regular sinus rhythm without murmurs or gallops. Lungs are clear to auscultation.  Extremities without edema.  Skin is warm and dry.       Assessment & Plan:  Elevated BP without diagnosis of hypertension  Encounter to establish care  Counseling on healthy diet to help control blood pressure.  DASH diet handout provided. She will keep an eye on her blood pressure over the next 4 weeks and return for a follow-up with her blood pressure readings and blood pressure machine. She declines labs today.  Prefers to have labs only at her CPE.

## 2020-11-29 NOTE — Patient Instructions (Addendum)
Limit sodium intake to 2,300 mg per day.   Check your BP at home and keep a record of the date, time and blood pressure readings.   Bring in your reading and machine in 4 weeks.     [email or web address].mata.[email or web address]">  DASH Eating Plan DASH stands for Dietary Approaches to Stop Hypertension. The DASH eating plan is a healthy eating plan that has been shown to:  Reduce high blood pressure (hypertension).  Reduce your risk for type 2 diabetes, heart disease, and stroke.  Help with weight loss. What are tips for following this plan? Reading food labels  Check food labels for the amount of salt (sodium) per serving. Choose foods with less than 5 percent of the Daily Value of sodium. Generally, foods with less than 300 milligrams (mg) of sodium per serving fit into this eating plan.  To find whole grains, look for the word "whole" as the first word in the ingredient list. Shopping  Buy products labeled as "low-sodium" or "no salt added."  Buy fresh foods. Avoid canned foods and pre-made or frozen meals. Cooking  Avoid adding salt when cooking. Use salt-free seasonings or herbs instead of table salt or sea salt. Check with your health care provider or pharmacist before using salt substitutes.  Do not fry foods. Cook foods using healthy methods such as baking, boiling, grilling, roasting, and broiling instead.  Cook with heart-healthy oils, such as olive, canola, avocado, soybean, or sunflower oil. Meal planning  Eat a balanced diet that includes: ? 4 or more servings of fruits and 4 or more servings of vegetables each day. Try to fill one-half of your plate with fruits and vegetables. ? 6-8 servings of whole grains each day. ? Less than 6 oz (170 g) of lean meat, poultry, or fish each day. A 3-oz (85-g) serving of meat is about the same size as a deck of cards. One egg equals 1 oz (28 g). ? 2-3 servings of low-fat dairy each day. One serving is 1 cup  (237 mL). ? 1 serving of nuts, seeds, or beans 5 times each week. ? 2-3 servings of heart-healthy fats. Healthy fats called omega-3 fatty acids are found in foods such as walnuts, flaxseeds, fortified milks, and eggs. These fats are also found in cold-water fish, such as sardines, salmon, and mackerel.  Limit how much you eat of: ? Canned or prepackaged foods. ? Food that is high in trans fat, such as some fried foods. ? Food that is high in saturated fat, such as fatty meat. ? Desserts and other sweets, sugary drinks, and other foods with added sugar. ? Full-fat dairy products.  Do not salt foods before eating.  Do not eat more than 4 egg yolks a week.  Try to eat at least 2 vegetarian meals a week.  Eat more home-cooked food and less restaurant, buffet, and fast food.   Lifestyle  When eating at a restaurant, ask that your food be prepared with less salt or no salt, if possible.  If you drink alcohol: ? Limit how much you use to:  0-1 drink a day for women who are not pregnant.  0-2 drinks a day for men. ? Be aware of how much alcohol is in your drink. In the [place] one drink equals one 12 oz bottle of beer (355 mL), one 5 oz glass of wine (148 mL), or one 1 oz glass of hard liquor (44 mL). General information  Avoid eating more than 2,300 mg of salt a  day. If you have hypertension, you may need to reduce your sodium intake to 1,500 mg a day.  Work with your health care provider to maintain a healthy body weight or to lose weight. Ask what an ideal weight is for you.  Get at least 30 minutes of exercise that causes your heart to beat faster (aerobic exercise) most days of the week. Activities may include walking, swimming, or biking.  Work with your health care provider or dietitian to adjust your eating plan to your individual calorie needs. What foods should I eat? Fruits All fresh, dried, or frozen fruit. Canned fruit in natural juice (without added  sugar). Vegetables Fresh or frozen vegetables (raw, steamed, roasted, or grilled). Low-sodium or reduced-sodium tomato and vegetable juice. Low-sodium or reduced-sodium tomato sauce and tomato paste. Low-sodium or reduced-sodium canned vegetables. Grains Whole-grain or whole-wheat bread. Whole-grain or whole-wheat pasta. Brown rice. [place]. Bulgur. Whole-grain and low-sodium cereals. Pita bread. Low-fat, low-sodium crackers. Whole-wheat flour tortillas. Meats and other proteins Skinless chicken or [place]. Ground chicken or [place]. Pork with fat trimmed off. Fish and seafood. Egg whites. Dried beans, peas, or lentils. Unsalted nuts, nut butters, and seeds. Unsalted canned beans. Lean cuts of beef with fat trimmed off. Low-sodium, lean precooked or cured meat, such as sausages or meat loaves. Dairy Low-fat (1%) or fat-free (skim) milk. Reduced-fat, low-fat, or fat-free cheeses. Nonfat, low-sodium ricotta or cottage cheese. Low-fat or nonfat yogurt. Low-fat, low-sodium cheese. Fats and oils Soft margarine without trans fats. Vegetable oil. Reduced-fat, low-fat, or light mayonnaise and salad dressings (reduced-sodium). Canola, safflower, olive, avocado, soybean, and sunflower oils. Avocado. Seasonings and condiments Herbs. Spices. Seasoning mixes without salt. Other foods Unsalted popcorn and pretzels. Fat-free sweets. The items listed above may not be a complete list of foods and beverages you can eat. Contact a dietitian for more information. What foods should I avoid? Fruits Canned fruit in a light or heavy syrup. Fried fruit. Fruit in cream or butter sauce. Vegetables Creamed or fried vegetables. Vegetables in a cheese sauce. Regular canned vegetables (not low-sodium or reduced-sodium). Regular canned tomato sauce and paste (not low-sodium or reduced-sodium). Regular tomato and vegetable juice (not low-sodium or reduced-sodium). Angie Fava. Olives. Grains Baked goods made with fat, such  as croissants, muffins, or some breads. Dry pasta or rice meal packs. Meats and other proteins Fatty cuts of meat. Ribs. Fried meat. Berniece Salines. Bologna, salami, and other precooked or cured meats, such as sausages or meat loaves. Fat from the back of a pig (fatback). Bratwurst. Salted nuts and seeds. Canned beans with added salt. Canned or smoked fish. Whole eggs or egg yolks. Chicken or [place] with skin. Dairy Whole or 2% milk, cream, and half-and-half. Whole or full-fat cream cheese. Whole-fat or sweetened yogurt. Full-fat cheese. Nondairy creamers. Whipped toppings. Processed cheese and cheese spreads. Fats and oils Butter. Stick margarine. Lard. Shortening. Ghee. Bacon fat. Tropical oils, such as coconut, palm kernel, or palm oil. Seasonings and condiments Onion salt, garlic salt, seasoned salt, table salt, and sea salt. Worcestershire sauce. Tartar sauce. Barbecue sauce. Teriyaki sauce. Soy sauce, including reduced-sodium. Steak sauce. Canned and packaged gravies. Fish sauce. Oyster sauce. Cocktail sauce. Store-bought horseradish. Ketchup. Mustard. Meat flavorings and tenderizers. Bouillon cubes. Hot sauces. Pre-made or packaged marinades. Pre-made or packaged taco seasonings. Relishes. Regular salad dressings. Other foods Salted popcorn and pretzels. The items listed above may not be a complete list of foods and beverages you should avoid. Contact a dietitian for more information. Where to find more  information  National Heart, Lung, and Blood Institute: https://wilson-eaton.com/  American Heart Association: www.heart.org  Academy of Nutrition and Dietetics: www.eatright.[place]: www.kidney.org Summary  The DASH eating plan is a healthy eating plan that has been shown to reduce high blood pressure (hypertension). It may also reduce your risk for type 2 diabetes, heart disease, and stroke.  When on the DASH eating plan, aim to eat more fresh fruits and vegetables, whole  grains, lean proteins, low-fat dairy, and heart-healthy fats.  With the DASH eating plan, you should limit salt (sodium) intake to 2,300 mg a day. If you have hypertension, you may need to reduce your sodium intake to 1,500 mg a day.  Work with your health care provider or dietitian to adjust your eating plan to your individual calorie needs. This information is not intended to replace advice given to you by your health care provider. Make sure you discuss any questions you have with your health care provider. Document Revised: 06/23/2019 Document Reviewed: 06/23/2019 Elsevier Patient Education  2021 [employer].

## 2020-12-04 ENCOUNTER — Encounter: Payer: Self-pay | Admitting: Internal Medicine

## 2020-12-05 ENCOUNTER — Encounter: Payer: Self-pay | Admitting: Family Medicine

## 2020-12-23 ENCOUNTER — Telehealth: Payer: Self-pay | Admitting: Family Medicine

## 2020-12-23 NOTE — Telephone Encounter (Signed)
Requested records received from [facility] 

## 2020-12-24 ENCOUNTER — Encounter: Payer: Self-pay | Admitting: Internal Medicine

## 2020-12-26 ENCOUNTER — Encounter: Payer: Self-pay | Admitting: Family Medicine

## 2021-01-03 ENCOUNTER — Other Ambulatory Visit: Payer: Self-pay

## 2021-01-03 ENCOUNTER — Ambulatory Visit (INDEPENDENT_AMBULATORY_CARE_PROVIDER_SITE_OTHER): Payer: 59 | Admitting: Family Medicine

## 2021-01-03 ENCOUNTER — Encounter: Payer: Self-pay | Admitting: Family Medicine

## 2021-01-03 VITALS — BP 124/70 | HR 66 | Wt 128.2 lb

## 2021-01-03 DIAGNOSIS — R030 Elevated blood-pressure reading, without diagnosis of hypertension: Secondary | ICD-10-CM

## 2021-01-03 NOTE — Progress Notes (Signed)
   Subjective:    Patient ID: Michele Stephenson, female    DOB: 1956/06/02, 65 y.o.   MRN: [id number]  HPI Chief Complaint  Patient presents with  . 4 week follow-up on HTN    4 week follow-up on htn, 117-138/ 81-95   Is here for a 4-week follow-up on elevated blood pressure without a diagnosis of hypertension.  Her daughter is with her and translating. 4 weeks ago was her first visit with me and she was here at that time due to elevated blood pressure at her OB/GYN office.  Since then, she has cut back on sodium and been checking her blood pressure at home which are mostly close to goal range.  She denies any new concerns today.  No headache, dizziness, chest pain, palpitations, shortness of breath or LE edema.   Review of Systems Pertinent positives and negatives in the history of present illness.     Objective:   Physical Exam BP 124/70   Pulse 66   Wt 128 lb 3.2 oz (58.2 kg)   LMP 02/29/2012   SpO2 99%   BMI 23.64 kg/m   Alert and oriented in no acute distress.  Respirations unlabored.  Lower extremities without edema.      Assessment & Plan:  Elevated BP without diagnosis of hypertension  Blood pressure normal today.  Discussed keeping an eye on her blood pressure and if she starts seeing elevated readings, she will let me know.  Continue with a low-sodium diet.  She has a follow-up visit in August for a fasting CPE.

## 2021-01-29 ENCOUNTER — Encounter: Payer: Self-pay | Admitting: Internal Medicine

## 2021-03-07 ENCOUNTER — Encounter: Payer: 59 | Admitting: Family Medicine

## 2022-06-17 ENCOUNTER — Encounter: Payer: Self-pay | Admitting: Internal Medicine
